# Patient Record
Sex: Male | Born: 1970
Health system: Southern US, Community
[De-identification: ages and names within clinical notes are randomized; demographics above are authoritative.]

## PROBLEM LIST (undated history)

## (undated) DIAGNOSIS — I10 Essential (primary) hypertension: Secondary | ICD-10-CM

---

## 2019-07-26 ENCOUNTER — Inpatient Hospital Stay (HOSPITAL_COMMUNITY)
Admission: EM | Admit: 2019-07-26 | Discharge: 2019-08-05 | DRG: 372 | Disposition: A | Discharge status: Home / Self Care | Payer: No Typology Code available for payment source | Attending: General Surgery | Admitting: General Surgery

## 2019-07-26 ENCOUNTER — Emergency Department (HOSPITAL_COMMUNITY): Payer: No Typology Code available for payment source

## 2019-07-26 DIAGNOSIS — Z20822 Contact with and (suspected) exposure to covid-19: Secondary | ICD-10-CM | POA: Diagnosis present

## 2019-07-26 DIAGNOSIS — B954 Other streptococcus as the cause of diseases classified elsewhere: Secondary | ICD-10-CM | POA: Diagnosis present

## 2019-07-26 DIAGNOSIS — Z1611 Resistance to penicillins: Secondary | ICD-10-CM | POA: Diagnosis present

## 2019-07-26 DIAGNOSIS — K3533 Acute appendicitis with perforation and localized peritonitis, with abscess: Principal | ICD-10-CM | POA: Diagnosis present

## 2019-07-26 DIAGNOSIS — B961 Klebsiella pneumoniae [K. pneumoniae] as the cause of diseases classified elsewhere: Secondary | ICD-10-CM | POA: Diagnosis present

## 2019-07-26 DIAGNOSIS — K3532 Acute appendicitis with perforation and localized peritonitis, without abscess: Secondary | ICD-10-CM | POA: Diagnosis present

## 2019-07-26 DIAGNOSIS — K651 Peritoneal abscess: Secondary | ICD-10-CM

## 2019-07-26 DIAGNOSIS — Z8249 Family history of ischemic heart disease and other diseases of the circulatory system: Secondary | ICD-10-CM

## 2019-07-26 DIAGNOSIS — K9189 Other postprocedural complications and disorders of digestive system: Secondary | ICD-10-CM | POA: Diagnosis not present

## 2019-07-26 DIAGNOSIS — Z79899 Other long term (current) drug therapy: Secondary | ICD-10-CM

## 2019-07-26 DIAGNOSIS — N289 Disorder of kidney and ureter, unspecified: Secondary | ICD-10-CM

## 2019-07-26 DIAGNOSIS — K567 Ileus, unspecified: Secondary | ICD-10-CM

## 2019-07-26 DIAGNOSIS — N179 Acute kidney failure, unspecified: Secondary | ICD-10-CM | POA: Diagnosis present

## 2019-07-26 DIAGNOSIS — I10 Essential (primary) hypertension: Secondary | ICD-10-CM | POA: Diagnosis not present

## 2019-07-26 DIAGNOSIS — Z0189 Encounter for other specified special examinations: Secondary | ICD-10-CM

## 2019-07-26 HISTORY — DX: Essential (primary) hypertension: I10

## 2019-07-26 LAB — CBC
HCT: 45.5 % (ref 39.0–52.0)
Hemoglobin: 15 g/dL (ref 13.0–17.0)
MCH: 31.3 pg (ref 26.0–34.0)
MCHC: 33 g/dL (ref 30.0–36.0)
MCV: 95 fL (ref 80.0–100.0)
Platelets: 352 10*3/uL (ref 150–400)
RBC: 4.79 MIL/uL (ref 4.22–5.81)
RDW: 12.8 % (ref 11.5–15.5)
WBC: 25.8 10*3/uL — ABNORMAL HIGH (ref 4.0–10.5)
nRBC: 0 % (ref 0.0–0.2)

## 2019-07-26 LAB — COMPREHENSIVE METABOLIC PANEL
ALT: 9 U/L (ref 0–44)
AST: 14 U/L — ABNORMAL LOW (ref 15–41)
Albumin: 3.5 g/dL (ref 3.5–5.0)
Alkaline Phosphatase: 75 U/L (ref 38–126)
Anion gap: 17 — ABNORMAL HIGH (ref 5–15)
BUN: 49 mg/dL — ABNORMAL HIGH (ref 6–20)
CO2: 24 mmol/L (ref 22–32)
Calcium: 9.5 mg/dL (ref 8.9–10.3)
Chloride: 99 mmol/L (ref 98–111)
Creatinine, Ser: 2.91 mg/dL — ABNORMAL HIGH (ref 0.61–1.24)
GFR calc Af Amer: 28 mL/min — ABNORMAL LOW (ref >60)
GFR calc non Af Amer: 24 mL/min — ABNORMAL LOW (ref >60)
Glucose, Bld: 158 mg/dL — ABNORMAL HIGH (ref 70–99)
Potassium: 4.9 mmol/L (ref 3.5–5.1)
Sodium: 140 mmol/L (ref 135–145)
Total Bilirubin: 0.8 mg/dL (ref 0.3–1.2)
Total Protein: 8.3 g/dL — ABNORMAL HIGH (ref 6.5–8.1)

## 2019-07-26 LAB — LIPASE, BLOOD: Lipase: 21 U/L (ref 11–51)

## 2019-07-26 MED ORDER — ONDANSETRON HCL 4 MG/2ML IJ SOLN
4.0000 mg | Freq: Once | INTRAMUSCULAR | Status: AC
  Administered 2019-07-26: 4 mg via INTRAVENOUS
  Filled 2019-07-26: qty 2

## 2019-07-26 MED ORDER — SODIUM CHLORIDE 0.9 % IV BOLUS
1000.0000 mL | Freq: Once | INTRAVENOUS | Status: AC
  Administered 2019-07-26: 1000 mL via INTRAVENOUS

## 2019-07-26 MED ORDER — MORPHINE SULFATE (PF) 4 MG/ML IV SOLN
4.0000 mg | Freq: Once | INTRAVENOUS | Status: AC
  Administered 2019-07-26: 4 mg via INTRAVENOUS
  Filled 2019-07-26: qty 1

## 2019-07-26 MED ORDER — IOHEXOL 9 MG/ML PO SOLN
ORAL | Status: AC
  Filled 2019-07-26: qty 1000

## 2019-07-26 MED ORDER — SODIUM CHLORIDE 0.9% FLUSH
3.0000 mL | Freq: Once | INTRAVENOUS | Status: AC
  Administered 2019-07-26: 3 mL via INTRAVENOUS

## 2019-07-26 NOTE — ED Triage Notes (Signed)
Pt here from home with c/o sob and abd pain  Started sat night along with some n/v . No etoh  And pt doesn't smoke

## 2019-07-26 NOTE — ED Provider Notes (Signed)
[facility] EMERGENCY DEPARTMENT Provider Note   CSN: [id number] Arrival date & time: 07/26/19  1701   History Chief complaint: Abdominal pain  Russell Wells is a 49 y.o. male.  The history is provided by the patient.  He has no significant past history and comes in because of abdominal pain for the last 3 days.  Pain started in the right midabdomen and is moved to the right lower abdomen.  It will occasionally go into his chest.  Pain waxes and wanes and is 10/10 at its worst.  Nothing makes it better, nothing makes it worse.  He has taken acetaminophen and ibuprofen without relief.  His sister gave him a dose of an antibiotic, but vomited after taking it.  He is not sure which antibiotic was.  There is associated nausea and vomiting.  He has not had fever or chills but has had sweats.  He denies constipation or diarrhea.  Urine output has been markedly decreased.  Appetite has been decreased as well.  He has never had similar pain before.  No past medical history on file.  There are no problems to display for this patient.   ** The histories are not reviewed yet. Please review them in the "History" navigator section and refresh this SmartLink.     No family history on file.  Social History   Tobacco Use  . Smoking status: Not on file  Substance Use Topics  . Alcohol use: Not on file  . Drug use: Not on file    Home Medications Prior to Admission medications   Not on File    Allergies    Patient has no known allergies.  Review of Systems   Review of Systems  All other systems reviewed and are negative.   Physical Exam Updated Vital Signs BP (!) 125/99   Pulse 72   Temp 97.9 F (36.6 C)   Resp 17   Ht 6' 1.5" (1.867 m)   Wt 68.9 kg   SpO2 97%   BMI 19.78 kg/m   Physical Exam Vitals and nursing note reviewed.   49 year old male, resting comfortably and in no acute distress. Vital signs are significant for elevated diastolic blood  pressure. Oxygen saturation is 97%, which is normal. Head is normocephalic and atraumatic. PERRLA, EOMI. Oropharynx is clear. Neck is nontender and supple without adenopathy or JVD. Back is nontender and there is no CVA tenderness. Lungs are clear without rales, wheezes, or rhonchi. Chest is nontender. Heart has regular rate and rhythm without murmur. Abdomen is soft, flat, with moderate to severe right lower quadrant tenderness.  There is no guarding or rebound tenderness, but there is tenderness to percussion.  There is mild tenderness to palpation in the left upper and left lower quadrants.  There are no masses or hepatosplenomegaly and peristalsis is hypoactive. Extremities have no cyanosis or edema, full range of motion is present. Skin is warm and dry without rash. Neurologic: Mental status is normal, cranial nerves are intact, there are no motor or sensory deficits.  ED Results / Procedures / Treatments   Labs (all labs ordered are listed, but only abnormal results are displayed) Labs Reviewed  COMPREHENSIVE METABOLIC PANEL - Abnormal; Notable for the following components:      Result Value   Glucose, Bld 158 (*)    BUN 49 (*)    Creatinine, Ser 2.91 (*)    Total Protein 8.3 (*)    AST 14 (*)  GFR calc non Af Amer 24 (*)    GFR calc Af Amer 28 (*)    Anion gap 17 (*)    All other components within normal limits  CBC - Abnormal; Notable for the following components:   WBC 25.8 (*)    All other components within normal limits  URINALYSIS, ROUTINE W REFLEX MICROSCOPIC - Abnormal; Notable for the following components:   Color, Urine AMBER (*)    APPearance CLOUDY (*)    Glucose, UA 50 (*)    Hgb urine dipstick SMALL (*)    Protein, ur 100 (*)    Non Squamous Epithelial 0-5 (*)    All other components within normal limits  RESPIRATORY PANEL BY RT PCR (FLU A&B, COVID)  LIPASE, BLOOD    EKG EKG Interpretation  Date/Time:  Monday July 26 2019 17:09:50 EDT Ventricular  Rate:  129 PR Interval:  120 QRS Duration: 74 QT Interval:  282 QTC Calculation: 413 R Axis:   51 Text Interpretation: Sinus tachycardia Biatrial enlargement Left ventricular hypertrophy ( Sokolow-Lyon , Romhilt-Estes ) Abnormal ECG No previous ECGs available Confirmed by Glynn Octave [phone]) on 07/26/2019 10:59:31 PM   Radiology CT ABDOMEN PELVIS WO CONTRAST  Result Date: 07/27/2019 CLINICAL DATA:  Right lower quadrant abdominal pain EXAM: CT ABDOMEN AND PELVIS WITHOUT CONTRAST TECHNIQUE: Multidetector CT imaging of the abdomen and pelvis was performed following the standard protocol without IV contrast. COMPARISON:  None. FINDINGS: Lower chest: The visualized heart size within normal limits. No pericardial fluid/thickening. No hiatal hernia. Minimal bibasilar dependent atelectasis. Hepatobiliary: The liver is normal in density without focal abnormality.The main portal vein is patent. No evidence of calcified gallstones, gallbladder wall thickening or biliary dilatation. Pancreas: Unremarkable. No pancreatic ductal dilatation or surrounding inflammatory changes. Spleen: Normal in size without focal abnormality. Adrenals/Urinary Tract: Both adrenal glands appear normal. The kidneys and collecting system appear normal without evidence of urinary tract calculus or hydronephrosis. Bladder is unremarkable. Stomach/Bowel: The stomach is normal in appearance. There is mildly dilated air and fluid-filled loops of small bowel measuring up to 3 cm without a focal area of narrowing. The distal ileal loops are decompressed. Within the right lower quadrant there is a dilated appendix measuring up to 9 mm in transverse dimension with surrounding mesenteric fat stranding changes. Adjacent to or within the appendix there is a small calcification and small foci of air. Extending from this region there is a loculated fluid and air collection measuring 5.9 x 2.2 cm, series 3, image 58. Surrounding fat stranding changes  are seen within the right lower quadrant. Vascular/Lymphatic: There are no enlarged mesenteric, retroperitoneal, or pelvic lymph nodes. Scattered aortic atherosclerotic calcifications are seen without aneurysmal dilatation. Reproductive: The prostate is unremarkable. Other: No evidence of abdominal wall mass or hernia. Musculoskeletal: No acute or significant osseous findings. IMPRESSION: 1. Findings consistent with acute appendicitis with a probable adjacent small foci of air and periappendiceal abscess measuring 5.9 x 2.2 cm. 2. Mildly dilated proximal small bowel loops, likely due to ileus. 3.  Aortic Atherosclerosis (ICD10-I70.0). Electronically Signed   By: Jonna Clark M.D.   On: 07/27/2019 02:41   DG Chest 2 View  Result Date: 07/26/2019 CLINICAL DATA:  Short of breath, abdominal pain for 2 days, nausea and vomiting EXAM: CHEST - 2 VIEW COMPARISON:  None. FINDINGS: Frontal and lateral views of the chest demonstrate an unremarkable cardiac silhouette. There is increased density in the left paratracheal region with deviation of the airway to the right, likely  due to an enlarged thyroid. Please correlate with physical exam findings. No airspace disease, effusion, or pneumothorax. No acute bony abnormalities. IMPRESSION: 1. No acute intrathoracic process. 2. Likely enlarged thyroid as above. Electronically Signed   By: Randa Ngo M.D.   On: 07/26/2019 17:55    Procedures Procedures (including critical care time)  Medications Ordered in ED Medications  sodium chloride flush (NS) 0.9 % injection 3 mL (has no administration in time range)  morphine 4 MG/ML injection 4 mg (has no administration in time range)  ondansetron (ZOFRAN) injection 4 mg (has no administration in time range)  sodium chloride 0.9 % bolus 1,000 mL (has no administration in time range)    ED Course  I have reviewed the triage vital signs and the nursing notes.  Pertinent labs & imaging results that were available during  my care of the patient were reviewed by me and considered in my medical decision making (see chart for details).  MDM Rules/Calculators/A&P Right lower quadrant pain concerning for appendicitis.  Consider diverticulitis, pancreatitis, urinary tract infection, urolithiasis.  Labs obtained prior to my seeing the patient show markedly elevated WBC of 25.8, mildly elevated glucose of 158, evidence of renal insufficiency with creatinine 2.91 and BUN 49.  No prior labs are available for comparison, and patient states that he does not have a primary care provider.  He moved from [place] but did not have any feelings with the medical system there.  I suspect elevated creatinine is mainly function of dehydration.  He will be sent for CT of abdomen and pelvis.  Because of elevated creatinine, scan will be done without IV contrast.  CT scan shows acute appendicitis with abscess and possible contained perforation.  He is started on Zosyn.  Case is discussed with Dr. Grandville Silos, on-call for general surgery, who agrees to admit the patient.  Final Clinical Impression(s) / ED Diagnoses Final diagnoses:  Acute appendicitis with appendiceal abscess  Renal insufficiency    Rx / DC Orders ED Discharge Orders    None       Delora Fuel, MD 20/25/42 [phone]

## 2019-07-27 ENCOUNTER — Inpatient Hospital Stay (HOSPITAL_COMMUNITY): Payer: No Typology Code available for payment source

## 2019-07-27 ENCOUNTER — Other Ambulatory Visit: Payer: Self-pay

## 2019-07-27 ENCOUNTER — Emergency Department (HOSPITAL_COMMUNITY): Payer: No Typology Code available for payment source

## 2019-07-27 ENCOUNTER — Encounter (HOSPITAL_COMMUNITY): Payer: Self-pay | Admitting: Radiology

## 2019-07-27 DIAGNOSIS — K3532 Acute appendicitis with perforation and localized peritonitis, without abscess: Secondary | ICD-10-CM | POA: Diagnosis not present

## 2019-07-27 DIAGNOSIS — K3533 Acute appendicitis with perforation and localized peritonitis, with abscess: Secondary | ICD-10-CM | POA: Diagnosis present

## 2019-07-27 DIAGNOSIS — Z8249 Family history of ischemic heart disease and other diseases of the circulatory system: Secondary | ICD-10-CM | POA: Diagnosis not present

## 2019-07-27 DIAGNOSIS — K9189 Other postprocedural complications and disorders of digestive system: Secondary | ICD-10-CM | POA: Diagnosis not present

## 2019-07-27 DIAGNOSIS — B954 Other streptococcus as the cause of diseases classified elsewhere: Secondary | ICD-10-CM | POA: Diagnosis present

## 2019-07-27 DIAGNOSIS — K567 Ileus, unspecified: Secondary | ICD-10-CM | POA: Diagnosis not present

## 2019-07-27 DIAGNOSIS — Z1611 Resistance to penicillins: Secondary | ICD-10-CM | POA: Diagnosis present

## 2019-07-27 DIAGNOSIS — N179 Acute kidney failure, unspecified: Secondary | ICD-10-CM | POA: Diagnosis present

## 2019-07-27 DIAGNOSIS — B961 Klebsiella pneumoniae [K. pneumoniae] as the cause of diseases classified elsewhere: Secondary | ICD-10-CM | POA: Diagnosis present

## 2019-07-27 DIAGNOSIS — Z20822 Contact with and (suspected) exposure to covid-19: Secondary | ICD-10-CM | POA: Diagnosis present

## 2019-07-27 DIAGNOSIS — Z79899 Other long term (current) drug therapy: Secondary | ICD-10-CM | POA: Diagnosis not present

## 2019-07-27 DIAGNOSIS — I10 Essential (primary) hypertension: Secondary | ICD-10-CM | POA: Diagnosis present

## 2019-07-27 LAB — BASIC METABOLIC PANEL
Anion gap: 14 (ref 5–15)
BUN: 56 mg/dL — ABNORMAL HIGH (ref 6–20)
CO2: 23 mmol/L (ref 22–32)
Calcium: 8.6 mg/dL — ABNORMAL LOW (ref 8.9–10.3)
Chloride: 104 mmol/L (ref 98–111)
Creatinine, Ser: 2.49 mg/dL — ABNORMAL HIGH (ref 0.61–1.24)
GFR calc Af Amer: 34 mL/min — ABNORMAL LOW (ref >60)
GFR calc non Af Amer: 29 mL/min — ABNORMAL LOW (ref >60)
Glucose, Bld: 90 mg/dL (ref 70–99)
Potassium: 4.9 mmol/L (ref 3.5–5.1)
Sodium: 141 mmol/L (ref 135–145)

## 2019-07-27 LAB — URINALYSIS, ROUTINE W REFLEX MICROSCOPIC
Bacteria, UA: NONE SEEN
Bilirubin Urine: NEGATIVE
Glucose, UA: 50 mg/dL — AB
Ketones, ur: NEGATIVE mg/dL
Leukocytes,Ua: NEGATIVE
Nitrite: NEGATIVE
Protein, ur: 100 mg/dL — AB
Specific Gravity, Urine: 1.02 (ref 1.005–1.030)
pH: 5 (ref 5.0–8.0)

## 2019-07-27 LAB — PROTIME-INR
INR: 1.3 — ABNORMAL HIGH (ref 0.8–1.2)
Prothrombin Time: 15.6 seconds — ABNORMAL HIGH (ref 11.4–15.2)

## 2019-07-27 LAB — RESPIRATORY PANEL BY RT PCR (FLU A&B, COVID)
Influenza A by PCR: NEGATIVE
Influenza B by PCR: NEGATIVE
SARS Coronavirus 2 by RT PCR: NEGATIVE

## 2019-07-27 LAB — HIV ANTIBODY (ROUTINE TESTING W REFLEX): HIV Screen 4th Generation wRfx: NONREACTIVE

## 2019-07-27 MED ORDER — MIDAZOLAM HCL 2 MG/2ML IJ SOLN
INTRAMUSCULAR | Status: AC | PRN
  Administered 2019-07-27 (×3): 1 mg via INTRAVENOUS

## 2019-07-27 MED ORDER — FLUMAZENIL 0.5 MG/5ML IV SOLN
INTRAVENOUS | Status: AC
  Filled 2019-07-27: qty 5

## 2019-07-27 MED ORDER — ACETAMINOPHEN 325 MG PO TABS: 650.0000 mg | ORAL_TABLET | Freq: Four times a day (QID) | ORAL | Status: DC | PRN

## 2019-07-27 MED ORDER — FENTANYL CITRATE (PF) 100 MCG/2ML IJ SOLN
INTRAMUSCULAR | Status: AC | PRN
  Administered 2019-07-27 (×3): 50 ug via INTRAVENOUS

## 2019-07-27 MED ORDER — SODIUM CHLORIDE 0.9 % IV SOLN: INTRAVENOUS | Status: DC

## 2019-07-27 MED ORDER — MIDAZOLAM HCL 2 MG/2ML IJ SOLN
INTRAMUSCULAR | Status: AC
  Filled 2019-07-27: qty 6

## 2019-07-27 MED ORDER — FENTANYL CITRATE (PF) 100 MCG/2ML IJ SOLN
INTRAMUSCULAR | Status: AC
  Filled 2019-07-27: qty 6

## 2019-07-27 MED ORDER — HYDROMORPHONE HCL 1 MG/ML IJ SOLN
1.0000 mg | INTRAMUSCULAR | Status: DC | PRN
  Administered 2019-07-27 – 2019-07-30 (×26): 1 mg via INTRAVENOUS
  Filled 2019-07-27 (×25): qty 1

## 2019-07-27 MED ORDER — PIPERACILLIN-TAZOBACTAM 3.375 G IVPB 30 MIN
3.3750 g | Freq: Once | INTRAVENOUS | Status: AC
  Administered 2019-07-27: 3.375 g via INTRAVENOUS
  Filled 2019-07-27: qty 50

## 2019-07-27 MED ORDER — ENOXAPARIN SODIUM 30 MG/0.3ML ~~LOC~~ SOLN
30.0000 mg | Freq: Every day | SUBCUTANEOUS | Status: DC
  Administered 2019-07-27 – 2019-07-28 (×2): 30 mg via SUBCUTANEOUS
  Filled 2019-07-27 (×2): qty 0.3

## 2019-07-27 MED ORDER — NALOXONE HCL 0.4 MG/ML IJ SOLN
INTRAMUSCULAR | Status: AC
  Filled 2019-07-27: qty 1

## 2019-07-27 MED ORDER — MORPHINE SULFATE (PF) 4 MG/ML IV SOLN
4.0000 mg | Freq: Once | INTRAVENOUS | Status: AC
  Administered 2019-07-27: 04:00:00 4 mg via INTRAVENOUS
  Filled 2019-07-27: qty 1

## 2019-07-27 MED ORDER — ONDANSETRON HCL 4 MG/2ML IJ SOLN
4.0000 mg | Freq: Four times a day (QID) | INTRAMUSCULAR | Status: DC | PRN
  Administered 2019-07-28 – 2019-08-02 (×3): 4 mg via INTRAVENOUS
  Filled 2019-07-27 (×4): qty 2

## 2019-07-27 MED ORDER — DIPHENHYDRAMINE HCL 25 MG PO CAPS
25.0000 mg | ORAL_CAPSULE | Freq: Four times a day (QID) | ORAL | Status: DC | PRN
  Administered 2019-07-28 – 2019-07-29 (×2): 25 mg via ORAL
  Filled 2019-07-27: qty 1

## 2019-07-27 MED ORDER — ONDANSETRON 4 MG PO TBDP: 4.0000 mg | ORAL_TABLET | Freq: Four times a day (QID) | ORAL | Status: DC | PRN

## 2019-07-27 MED ORDER — LIDOCAINE HCL 1 % IJ SOLN
INTRAMUSCULAR | Status: AC
  Filled 2019-07-27: qty 20

## 2019-07-27 MED ORDER — DIPHENHYDRAMINE HCL 50 MG/ML IJ SOLN: 25.0000 mg | Freq: Four times a day (QID) | INTRAMUSCULAR | Status: DC | PRN

## 2019-07-27 MED ORDER — PIPERACILLIN-TAZOBACTAM 3.375 G IVPB
3.3750 g | Freq: Three times a day (TID) | INTRAVENOUS | Status: DC
  Administered 2019-07-27 – 2019-08-05 (×27): 3.375 g via INTRAVENOUS
  Filled 2019-07-27 (×27): qty 50

## 2019-07-27 MED ORDER — ACETAMINOPHEN 650 MG RE SUPP: 650.0000 mg | Freq: Four times a day (QID) | RECTAL | Status: DC | PRN

## 2019-07-27 NOTE — Procedures (Signed)
  Procedure: CT RLQ abscess drain catheter placement 70f;  34ml purulent for GS, C&S EBL:   minimal Complications:  none immediate  See full dictation in [employer].  Thora Lance MD Main # [phone] Pager  [phone]

## 2019-07-27 NOTE — Plan of Care (Signed)
  Problem: Clinical Measurements: Goal: Respiratory complications will improve Outcome: Progressing   Problem: Clinical Measurements: Goal: Ability to maintain clinical measurements within normal limits will improve Outcome: Progressing   Problem: Clinical Measurements: Goal: Respiratory complications will improve Outcome: Progressing   Problem: Nutrition: Goal: Adequate nutrition will be maintained Outcome: Not Progressing

## 2019-07-27 NOTE — H&P (Signed)
Russell Wells is an 49 y.o. male.   Chief Complaint: RLQ abdominal pain HPI: 49 year old male developed right lower quadrant abdominal pain early Saturday morning.  The pain initially got a little bit better but then worsened again and has been quite severe.  He has associated nausea and did vomit one time.  He has not had any appetite.  He came to the emergency department for further evaluation and CT scan of the abdomen pelvis demonstrates perforated appendicitis with abscess.  I was asked to see him for admission.  He reports that he is otherwise healthy.  He recently moved here from [place].  History reviewed. No pertinent past medical history.   No family history on file. Social History:  has no history on file for tobacco, alcohol, and drug.  Allergies: No Known Allergies  (Not in a hospital admission)   Results for orders placed or performed during the hospital encounter of 07/26/19 (from the past 48 hour(s))  Urinalysis, Routine w reflex microscopic     Status: Abnormal   Collection Time: 07/26/19 12:40 AM  Result Value Ref Range   Color, Urine AMBER (A) YELLOW    Comment: BIOCHEMICALS MAY BE AFFECTED BY COLOR   APPearance CLOUDY (A) CLEAR   Specific Gravity, Urine 1.020 1.005 - 1.030   pH 5.0 5.0 - 8.0   Glucose, UA 50 (A) NEGATIVE mg/dL   Hgb urine dipstick SMALL (A) NEGATIVE   Bilirubin Urine NEGATIVE NEGATIVE   Ketones, ur NEGATIVE NEGATIVE mg/dL   Protein, ur 253 (A) NEGATIVE mg/dL   Nitrite NEGATIVE NEGATIVE   Leukocytes,Ua NEGATIVE NEGATIVE   RBC / HPF 0-5 0 - 5 RBC/hpf   WBC, UA 11-20 0 - 5 WBC/hpf   Bacteria, UA NONE SEEN NONE SEEN   Squamous Epithelial / LPF 0-5 0 - 5   Mucus PRESENT    Hyaline Casts, UA PRESENT    Amorphous Crystal PRESENT    Non Squamous Epithelial 0-5 (A) NONE SEEN    Comment: Performed at [facility], [address] [place], [place] [address]  Lipase, blood     Status: None   Collection Time: 07/26/19  5:20 PM  Result Value  Ref Range   Lipase 21 11 - 51 U/L    Comment: Performed at [facility], [address], [place], [place] [address]  Comprehensive metabolic panel     Status: Abnormal   Collection Time: 07/26/19  5:20 PM  Result Value Ref Range   Sodium 140 135 - 145 mmol/L   Potassium 4.9 3.5 - 5.1 mmol/L   Chloride 99 98 - 111 mmol/L   CO2 24 22 - 32 mmol/L   Glucose, Bld 158 (H) 70 - 99 mg/dL    Comment: Glucose reference range applies only to samples taken after fasting for at least 8 hours.   BUN 49 (H) 6 - 20 mg/dL   Creatinine, Ser 5.95 (H) 0.61 - 1.24 mg/dL   Calcium 9.5 8.9 - 63.8 mg/dL   Total Protein 8.3 (H) 6.5 - 8.1 g/dL   Albumin 3.5 3.5 - 5.0 g/dL   AST 14 (L) 15 - 41 U/L   ALT 9 0 - 44 U/L   Alkaline Phosphatase 75 38 - 126 U/L   Total Bilirubin 0.8 0.3 - 1.2 mg/dL   GFR calc non Af Amer 24 (L) >60 mL/min   GFR calc Af Amer 28 (L) >60 mL/min   Anion gap 17 (H) 5 - 15  Comment: Performed at [facility], [address], [place], [place] [address]  CBC     Status: Abnormal   Collection Time: 07/26/19  5:20 PM  Result Value Ref Range   WBC 25.8 (H) 4.0 - 10.5 K/uL   RBC 4.79 4.22 - 5.81 MIL/uL   Hemoglobin 15.0 13.0 - 17.0 g/dL   HCT 93.2 67.1 - 24.5 %   MCV 95.0 80.0 - 100.0 fL   MCH 31.3 26.0 - 34.0 pg   MCHC 33.0 30.0 - 36.0 g/dL   RDW 80.9 98.3 - 38.2 %   Platelets 352 150 - 400 K/uL   nRBC 0.0 0.0 - 0.2 %    Comment: Performed at [facility], [address], [place], [place] [address]   CT ABDOMEN PELVIS WO CONTRAST  Result Date: 07/27/2019 CLINICAL DATA:  Right lower quadrant abdominal pain EXAM: CT ABDOMEN AND PELVIS WITHOUT CONTRAST TECHNIQUE: Multidetector CT imaging of the abdomen and pelvis was performed following the standard protocol without IV contrast. COMPARISON:  None. FINDINGS: Lower chest: The visualized heart size within normal limits. No pericardial fluid/thickening. No hiatal hernia. Minimal bibasilar dependent atelectasis.  Hepatobiliary: The liver is normal in density without focal abnormality.The main portal vein is patent. No evidence of calcified gallstones, gallbladder wall thickening or biliary dilatation. Pancreas: Unremarkable. No pancreatic ductal dilatation or surrounding inflammatory changes. Spleen: Normal in size without focal abnormality. Adrenals/Urinary Tract: Both adrenal glands appear normal. The kidneys and collecting system appear normal without evidence of urinary tract calculus or hydronephrosis. Bladder is unremarkable. Stomach/Bowel: The stomach is normal in appearance. There is mildly dilated air and fluid-filled loops of small bowel measuring up to 3 cm without a focal area of narrowing. The distal ileal loops are decompressed. Within the right lower quadrant there is a dilated appendix measuring up to 9 mm in transverse dimension with surrounding mesenteric fat stranding changes. Adjacent to or within the appendix there is a small calcification and small foci of air. Extending from this region there is a loculated fluid and air collection measuring 5.9 x 2.2 cm, series 3, image 58. Surrounding fat stranding changes are seen within the right lower quadrant. Vascular/Lymphatic: There are no enlarged mesenteric, retroperitoneal, or pelvic lymph nodes. Scattered aortic atherosclerotic calcifications are seen without aneurysmal dilatation. Reproductive: The prostate is unremarkable. Other: No evidence of abdominal wall mass or hernia. Musculoskeletal: No acute or significant osseous findings. IMPRESSION: 1. Findings consistent with acute appendicitis with a probable adjacent small foci of air and periappendiceal abscess measuring 5.9 x 2.2 cm. 2. Mildly dilated proximal small bowel loops, likely due to ileus. 3.  Aortic Atherosclerosis (ICD10-I70.0). Electronically Signed   By: Jonna Clark M.D.   On: 07/27/2019 02:41   DG Chest 2 View  Result Date: 07/26/2019 CLINICAL DATA:  Short of breath, abdominal pain  for 2 days, nausea and vomiting EXAM: CHEST - 2 VIEW COMPARISON:  None. FINDINGS: Frontal and lateral views of the chest demonstrate an unremarkable cardiac silhouette. There is increased density in the left paratracheal region with deviation of the airway to the right, likely due to an enlarged thyroid. Please correlate with physical exam findings. No airspace disease, effusion, or pneumothorax. No acute bony abnormalities. IMPRESSION: 1. No acute intrathoracic process. 2. Likely enlarged thyroid as above. Electronically Signed   By: Sharlet Salina M.D.   On: 07/26/2019 17:55    Review of Systems  Constitutional: Positive for fatigue.  HENT: Negative.   Eyes: Negative.  Respiratory: Positive for shortness of breath.   Cardiovascular: Negative.   Gastrointestinal: Positive for abdominal pain, nausea and vomiting.  Endocrine: Negative.   Genitourinary: Negative.   Musculoskeletal: Negative.   Skin: Negative.   Allergic/Immunologic: Negative.   Neurological: Negative.   Hematological: Negative.   Psychiatric/Behavioral: Negative.     Blood pressure (!) 166/108, pulse 95, temperature 97.9 F (36.6 C), resp. rate (!) 22, height 6' 1.5" (1.867 m), weight 68.9 kg, SpO2 96 %. Physical Exam  Constitutional: He is oriented to person, place, and time. He appears well-developed and well-nourished. No distress.  HENT:  Head: Normocephalic.  Right Ear: External ear normal.  Left Ear: External ear normal.  Mouth/Throat: Oropharynx is clear and moist.  Eyes: Pupils are equal, round, and reactive to light. EOM are normal. Right eye exhibits no discharge. Left eye exhibits no discharge. No scleral icterus.  Neck: No thyromegaly present.  Cardiovascular: Normal rate, regular rhythm, normal heart sounds and intact distal pulses.  Respiratory: Effort normal and breath sounds normal. No respiratory distress. He has no wheezes. He has no rales.  GI: Soft. Bowel sounds are normal. He exhibits no  distension. There is abdominal tenderness. There is no rebound and no guarding.  Tender right lower quadrant, no hepatosplenomegaly  Musculoskeletal:        General: No deformity or edema. Normal range of motion.     Cervical back: Neck supple.  Lymphadenopathy:    He has no cervical adenopathy.  Neurological: He is alert and oriented to person, place, and time. He exhibits normal muscle tone.  Skin: Skin is warm and dry. No rash noted.  Psychiatric: He has a normal mood and affect.  Alert and oriented x3     Assessment/Plan Perforated appendicitis with abscess -Zosyn IV, NPO, IR consult for percutaneous drain.  Likely interval appendectomy in the future.  Acute kidney injury -IV fluids and follow-up labs  Admit to inpatient  Zenovia Jarred, MD 07/27/2019, 3:58 AM

## 2019-07-27 NOTE — Consult Note (Signed)
Chief Complaint: Intra abdominal pain - periappendiceal abscess  Referring Physician(s): Dr. Elwyn Lade  Supervising Physician: Richarda Overlie  Patient Status: [facility] - In-pt  History of Present Illness: Russell Wells is a 49 y.o. male Presented to the ED with RLQ persistent and worsening abdominal pain X 3 days found to have a periappendiceal abscess. Team is requesting abscess drain placement.   History reviewed. No pertinent past medical history.  History reviewed. No pertinent surgical history.  Allergies: Patient has no known allergies.  Medications: Prior to Admission medications   Not on File     History reviewed. No pertinent family history.  Social History   Socioeconomic History  . Marital status: Not on file    Spouse name: Not on file  . Number of children: Not on file  . Years of education: Not on file  . Highest education level: Not on file  Occupational History  . Not on file  Tobacco Use  . Smoking status: Never Smoker  . Smokeless tobacco: Never Used  Substance and Sexual Activity  . Alcohol use: Not on file  . Drug use: Not on file  . Sexual activity: Not on file  Other Topics Concern  . Not on file  Social History Narrative  . Not on file   Social Determinants of Health   Financial Resource Strain:   . Difficulty of Paying Living Expenses:   Food Insecurity:   . Worried About Programme researcher, broadcasting/film/video in the Last Year:   . Barista in the Last Year:   Transportation Needs:   . Freight forwarder (Medical):   Marland Kitchen Lack of Transportation (Non-Medical):   Physical Activity:   . Days of Exercise per Week:   . Minutes of Exercise per Session:   Stress:   . Feeling of Stress :   Social Connections:   . Frequency of Communication with Friends and Family:   . Frequency of Social Gatherings with Friends and Family:   . Attends Religious Services:   . Active Member of Clubs or Organizations:   . Attends Banker Meetings:    Marland Kitchen Marital Status:     Review of Systems: A 12 point ROS discussed and pertinent positives are indicated in the HPI above.  All other systems are negative.  Review of Systems  Constitutional: Negative for fever.  HENT: Negative for congestion.   Respiratory: Negative for cough and shortness of breath.   Cardiovascular: Negative for chest pain.  Gastrointestinal: Positive for abdominal pain.  Neurological: Negative for headaches.  Psychiatric/Behavioral: Negative for behavioral problems and confusion.    Vital Signs: BP (!) 148/105 (BP Location: Left Arm)   Pulse 96   Temp 97.7 F (36.5 C) (Oral)   Resp 18   Ht 6' 1.5" (1.867 m)   Wt 152 lb (68.9 kg)   SpO2 97%   BMI 19.78 kg/m   Physical Exam Vitals and nursing note reviewed.  Constitutional:      Appearance: He is well-developed.  HENT:     Head: Normocephalic.  Cardiovascular:     Rate and Rhythm: Normal rate and regular rhythm.     Heart sounds: Normal heart sounds.  Pulmonary:     Effort: Pulmonary effort is normal.     Breath sounds: Normal breath sounds.  Musculoskeletal:        General: Normal range of motion.     Cervical back: Normal range of motion.  Skin:    General: Skin  is dry.  Neurological:     Mental Status: He is alert and oriented to person, place, and time.     Imaging: CT ABDOMEN PELVIS WO CONTRAST  Result Date: 07/27/2019 CLINICAL DATA:  Right lower quadrant abdominal pain EXAM: CT ABDOMEN AND PELVIS WITHOUT CONTRAST TECHNIQUE: Multidetector CT imaging of the abdomen and pelvis was performed following the standard protocol without IV contrast. COMPARISON:  None. FINDINGS: Lower chest: The visualized heart size within normal limits. No pericardial fluid/thickening. No hiatal hernia. Minimal bibasilar dependent atelectasis. Hepatobiliary: The liver is normal in density without focal abnormality.The main portal vein is patent. No evidence of calcified gallstones, gallbladder wall thickening or  biliary dilatation. Pancreas: Unremarkable. No pancreatic ductal dilatation or surrounding inflammatory changes. Spleen: Normal in size without focal abnormality. Adrenals/Urinary Tract: Both adrenal glands appear normal. The kidneys and collecting system appear normal without evidence of urinary tract calculus or hydronephrosis. Bladder is unremarkable. Stomach/Bowel: The stomach is normal in appearance. There is mildly dilated air and fluid-filled loops of small bowel measuring up to 3 cm without a focal area of narrowing. The distal ileal loops are decompressed. Within the right lower quadrant there is a dilated appendix measuring up to 9 mm in transverse dimension with surrounding mesenteric fat stranding changes. Adjacent to or within the appendix there is a small calcification and small foci of air. Extending from this region there is a loculated fluid and air collection measuring 5.9 x 2.2 cm, series 3, image 58. Surrounding fat stranding changes are seen within the right lower quadrant. Vascular/Lymphatic: There are no enlarged mesenteric, retroperitoneal, or pelvic lymph nodes. Scattered aortic atherosclerotic calcifications are seen without aneurysmal dilatation. Reproductive: The prostate is unremarkable. Other: No evidence of abdominal wall mass or hernia. Musculoskeletal: No acute or significant osseous findings. IMPRESSION: 1. Findings consistent with acute appendicitis with a probable adjacent small foci of air and periappendiceal abscess measuring 5.9 x 2.2 cm. 2. Mildly dilated proximal small bowel loops, likely due to ileus. 3.  Aortic Atherosclerosis (ICD10-I70.0). Electronically Signed   By: Prudencio Pair M.D.   On: 07/27/2019 02:41   DG Chest 2 View  Result Date: 07/26/2019 CLINICAL DATA:  Short of breath, abdominal pain for 2 days, nausea and vomiting EXAM: CHEST - 2 VIEW COMPARISON:  None. FINDINGS: Frontal and lateral views of the chest demonstrate an unremarkable cardiac silhouette. There  is increased density in the left paratracheal region with deviation of the airway to the right, likely due to an enlarged thyroid. Please correlate with physical exam findings. No airspace disease, effusion, or pneumothorax. No acute bony abnormalities. IMPRESSION: 1. No acute intrathoracic process. 2. Likely enlarged thyroid as above. Electronically Signed   By: Randa Ngo M.D.   On: 07/26/2019 17:55    Labs:  CBC: Recent Labs    07/26/19 1720  WBC 25.8*  HGB 15.0  HCT 45.5  PLT 352    COAGS: Recent Labs    07/27/19 0506  INR 1.3*    BMP: Recent Labs    07/26/19 1720  NA 140  K 4.9  CL 99  CO2 24  GLUCOSE 158*  BUN 49*  CALCIUM 9.5  CREATININE 2.91*  GFRNONAA 24*  GFRAA 28*    LIVER FUNCTION TESTS: Recent Labs    07/26/19 1720  BILITOT 0.8  AST 14*  ALT 9  ALKPHOS 75  PROT 8.3*  ALBUMIN 3.5    TUMOR MARKERS: No results for input(s): AFPTM, CEA, CA199, CHROMGRNA in the last 8760 hours.  Assessment and Plan:  49 y.o, male inpatient. Presented to the ED with RLQ persistent and worsening abdominal pain X 3 days found to have a periappendiceal abscess. Team is requesting abscess drain placement.  Pertinent Imaging 3.23.21 - CT abd pelvis reads Findings consistent with acute appendicitis with a probable adjacent small foci of air and periappendiceal abscess measuring 5.9 x 2.2 cm.  Pertinent IR History none  Pertinent Allergies NKDA  WBC is 25.8, Cr 2.91 BUN 49, INR 1.3 All other labs and medications are within acceptable parameters.  Patient is afebrile.  IR consulted for possible intra abdominal periappendical abscess drain placement. Case has been reviewed and procedure approved by Dr. Deanne Coffer.  Patient tentatively scheduled for 3.23.21.  Team instructed to: Keep Patient to be NPO after midnight Hold prophylactic anticoagulation 24 hours prior to scheduled procedure.   IR will call patient when ready.  Risks and benefits of intra  abdominal abscess was discussed with the patient and/or patient's family including, but not limited to bleeding, infection, damage to adjacent structures or low yield requiring additional tests.  All of the questions were answered and there is agreement to proceed.  Consent signed and in chart.   Thank you for this interesting consult.  I greatly enjoyed meeting Welcome Fults and look forward to participating in their care.  A copy of this report was sent to the requesting provider on this date.  Electronically Signed: Marletta Lor, NP 07/27/2019, 9:31 AM   I spent a total of 40 Minutes    in face to face in clinical consultation, greater than 50% of which was counseling/coordinating care for intra abdominal abscess drain placement

## 2019-07-27 NOTE — Progress Notes (Signed)
Pharmacy Antibiotic Note  Russell Wells is a 49 y.o. male admitted on 07/26/2019 with perforated appendicitis with abscess.  Pharmacy has been consulted for zosyn  dosing.  Plan: Zosyn 3.375g IV q8h (4 hour infusion).  F/u renal function, cultures and clinical course  Height: 6' 1.5" (186.7 cm) Weight: 152 lb (68.9 kg) IBW/kg (Calculated) : 81.05  Temp (24hrs), Avg:98 F (36.7 C), Min:97.9 F (36.6 C), Max:98 F (36.7 C)  Recent Labs  Lab 07/26/19 1720  WBC 25.8*  CREATININE 2.91*    Estimated Creatinine Clearance: 29.9 mL/min (A) (by C-G formula based on SCr of 2.91 mg/dL (H)).    No Known Allergies   Thank you for allowing pharmacy to be a part of this patient's care.  Talbert Cage Poteet 07/27/2019 4:03 AM

## 2019-07-27 NOTE — Progress Notes (Signed)
Patient ID: Russell Wells, male   DOB: 20-Apr-1971, 49 y.o.   MRN: [id number] Ir drain in, bmet pending today, will let him have clears today, cont abx, start lovenox

## 2019-07-27 NOTE — ED Notes (Signed)
Pt transported to CT ?

## 2019-07-28 ENCOUNTER — Inpatient Hospital Stay (HOSPITAL_COMMUNITY): Payer: No Typology Code available for payment source

## 2019-07-28 LAB — CBC
HCT: 41.5 % (ref 39.0–52.0)
Hemoglobin: 13.3 g/dL (ref 13.0–17.0)
MCH: 30.5 pg (ref 26.0–34.0)
MCHC: 32 g/dL (ref 30.0–36.0)
MCV: 95.2 fL (ref 80.0–100.0)
Platelets: 400 10*3/uL (ref 150–400)
RBC: 4.36 MIL/uL (ref 4.22–5.81)
RDW: 13 % (ref 11.5–15.5)
WBC: 19.5 10*3/uL — ABNORMAL HIGH (ref 4.0–10.5)
nRBC: 0 % (ref 0.0–0.2)

## 2019-07-28 LAB — BASIC METABOLIC PANEL
Anion gap: 12 (ref 5–15)
BUN: 46 mg/dL — ABNORMAL HIGH (ref 6–20)
CO2: 23 mmol/L (ref 22–32)
Calcium: 8.7 mg/dL — ABNORMAL LOW (ref 8.9–10.3)
Chloride: 105 mmol/L (ref 98–111)
Creatinine, Ser: 1.77 mg/dL — ABNORMAL HIGH (ref 0.61–1.24)
GFR calc Af Amer: 51 mL/min — ABNORMAL LOW (ref >60)
GFR calc non Af Amer: 44 mL/min — ABNORMAL LOW (ref >60)
Glucose, Bld: 127 mg/dL — ABNORMAL HIGH (ref 70–99)
Potassium: 4.9 mmol/L (ref 3.5–5.1)
Sodium: 140 mmol/L (ref 135–145)

## 2019-07-28 MED ORDER — HYDROCODONE-ACETAMINOPHEN 5-325 MG PO TABS
1.0000 | ORAL_TABLET | ORAL | Status: DC | PRN
  Administered 2019-07-29 – 2019-07-30 (×2): 2 via ORAL
  Filled 2019-07-28 (×2): qty 2

## 2019-07-28 MED ORDER — PHENOL 1.4 % MT LIQD: 1.0000 | OROMUCOSAL | Status: DC | PRN

## 2019-07-28 MED ORDER — HYDRALAZINE HCL 20 MG/ML IJ SOLN
5.0000 mg | Freq: Four times a day (QID) | INTRAMUSCULAR | Status: DC | PRN
  Administered 2019-07-28 – 2019-07-30 (×4): 20 mg via INTRAVENOUS
  Administered 2019-07-30: 03:00:00 10 mg via INTRAVENOUS
  Administered 2019-07-31: 05:00:00 20 mg via INTRAVENOUS
  Filled 2019-07-28 (×6): qty 1

## 2019-07-28 MED ORDER — SODIUM CHLORIDE 0.9% FLUSH
5.0000 mL | Freq: Three times a day (TID) | INTRAVENOUS | Status: DC
  Administered 2019-07-28 – 2019-08-05 (×17): 5 mL

## 2019-07-28 MED ORDER — MENTHOL 3 MG MT LOZG
1.0000 | LOZENGE | OROMUCOSAL | Status: DC | PRN
  Administered 2019-07-28: 16:00:00 3 mg via ORAL
  Filled 2019-07-28: qty 9

## 2019-07-28 MED ORDER — METOPROLOL TARTRATE 5 MG/5ML IV SOLN
5.0000 mg | Freq: Four times a day (QID) | INTRAVENOUS | Status: DC | PRN
  Administered 2019-07-28: 23:00:00 10 mg via INTRAVENOUS
  Filled 2019-07-28: qty 10

## 2019-07-28 MED ORDER — ENOXAPARIN SODIUM 40 MG/0.4ML ~~LOC~~ SOLN
40.0000 mg | Freq: Every day | SUBCUTANEOUS | Status: DC
  Administered 2019-07-29 – 2019-08-02 (×5): 40 mg via SUBCUTANEOUS
  Filled 2019-07-28 (×5): qty 0.4

## 2019-07-28 NOTE — Progress Notes (Signed)
Subjective/Chief Complaint: Abdomen hurts much worse after drain, no bowel function   Objective: Vital signs in last 24 hours: Temp:  [97.6 F (36.4 C)-98.7 F (37.1 C)] 97.6 F (36.4 C) (03/24 0723) Pulse Rate:  [78-102] 98 (03/24 0723) Resp:  [15-23] 17 (03/24 0723) BP: (117-172)/(74-124) 172/124 (03/24 0723) SpO2:  [94 %-100 %] 98 % (03/24 0723)    Intake/Output from previous day: 03/23 0701 - 03/24 0700 In: 650 [P.O.:600] Out: 1950 [Urine:1950] Intake/Output this shift: No intake/output data recorded.  Resp: clear to auscultation bilaterally Cardio: rrr GI: tender rlq and suprapubic, nondistended  Lab Results:  Recent Labs    07/26/19 1720 07/28/19 0606  WBC 25.8* 19.5*  HGB 15.0 13.3  HCT 45.5 41.5  PLT 352 400   BMET Recent Labs    07/27/19 1144 07/28/19 0606  NA 141 140  K 4.9 4.9  CL 104 105  CO2 23 23  GLUCOSE 90 127*  BUN 56* 46*  CREATININE 2.49* 1.77*  CALCIUM 8.6* 8.7*   PT/INR Recent Labs    07/27/19 0506  LABPROT 15.6*  INR 1.3*   ABG No results for input(s): PHART, HCO3 in the last 72 hours.  Invalid input(s): PCO2, PO2  Studies/Results: CT ABDOMEN PELVIS WO CONTRAST  Result Date: 07/27/2019 CLINICAL DATA:  Right lower quadrant abdominal pain EXAM: CT ABDOMEN AND PELVIS WITHOUT CONTRAST TECHNIQUE: Multidetector CT imaging of the abdomen and pelvis was performed following the standard protocol without IV contrast. COMPARISON:  None. FINDINGS: Lower chest: The visualized heart size within normal limits. No pericardial fluid/thickening. No hiatal hernia. Minimal bibasilar dependent atelectasis. Hepatobiliary: The liver is normal in density without focal abnormality.The main portal vein is patent. No evidence of calcified gallstones, gallbladder wall thickening or biliary dilatation. Pancreas: Unremarkable. No pancreatic ductal dilatation or surrounding inflammatory changes. Spleen: Normal in size without focal abnormality.  Adrenals/Urinary Tract: Both adrenal glands appear normal. The kidneys and collecting system appear normal without evidence of urinary tract calculus or hydronephrosis. Bladder is unremarkable. Stomach/Bowel: The stomach is normal in appearance. There is mildly dilated air and fluid-filled loops of small bowel measuring up to 3 cm without a focal area of narrowing. The distal ileal loops are decompressed. Within the right lower quadrant there is a dilated appendix measuring up to 9 mm in transverse dimension with surrounding mesenteric fat stranding changes. Adjacent to or within the appendix there is a small calcification and small foci of air. Extending from this region there is a loculated fluid and air collection measuring 5.9 x 2.2 cm, series 3, image 58. Surrounding fat stranding changes are seen within the right lower quadrant. Vascular/Lymphatic: There are no enlarged mesenteric, retroperitoneal, or pelvic lymph nodes. Scattered aortic atherosclerotic calcifications are seen without aneurysmal dilatation. Reproductive: The prostate is unremarkable. Other: No evidence of abdominal wall mass or hernia. Musculoskeletal: No acute or significant osseous findings. IMPRESSION: 1. Findings consistent with acute appendicitis with a probable adjacent small foci of air and periappendiceal abscess measuring 5.9 x 2.2 cm. 2. Mildly dilated proximal small bowel loops, likely due to ileus. 3.  Aortic Atherosclerosis (ICD10-I70.0). Electronically Signed   By: Prudencio Pair M.D.   On: 07/27/2019 02:41   DG Chest 2 View  Result Date: 07/26/2019 CLINICAL DATA:  Short of breath, abdominal pain for 2 days, nausea and vomiting EXAM: CHEST - 2 VIEW COMPARISON:  None. FINDINGS: Frontal and lateral views of the chest demonstrate an unremarkable cardiac silhouette. There is increased density in the left paratracheal  region with deviation of the airway to the right, likely due to an enlarged thyroid. Please correlate with physical  exam findings. No airspace disease, effusion, or pneumothorax. No acute bony abnormalities. IMPRESSION: 1. No acute intrathoracic process. 2. Likely enlarged thyroid as above. Electronically Signed   By: Sharlet Salina M.D.   On: 07/26/2019 17:55   CT IMAGE GUIDED DRAINAGE BY PERCUTANEOUS CATHETER  Result Date: 07/27/2019 CLINICAL DATA:  Suspected periappendiceal abscess. Drainage requested EXAM: CT GUIDED DRAINAGE OF PERITONEAL ABSCESS ANESTHESIA/SEDATION: Intravenous Fentanyl and Versed 3mg  were administered as conscious sedation during continuous monitoring of the patient?s level of consciousness and physiological / cardiorespiratory status by the radiology RN, with a total moderate sedation time of 17 minutes. PROCEDURE: The procedure, risks, benefits, and alternatives were explained to the patient. Questions regarding the procedure were encouraged and answered. The patient understands and consents to the procedure. Select axial scans through the lower abdomen were obtained. The right lower quadrant gas and fluid collection was localized and an appropriate skin entry site was determined and marked. The operative field was prepped with chlorhexidinein a sterile fashion, and a sterile drape was applied covering the operative field. A sterile gown and sterile gloves were used for the procedure. Local anesthesia was provided with 1% Lidocaine. Under CT fluoroscopic guidance, 18 gauge trocar needle advanced into the collection. Purulent material returned through the hub. Amplatz wire advanced easily within the collection, position confirmed on CT. Tract dilated to facilitate placement of a 12 French pigtail drain catheter, formed within the central dependent aspect. 20 mL purulent material were aspirated, sent for Gram stain and culture. Catheter secured externally with 0 Prolene suture and StatLock and placed to gravity drain. The patient tolerated the procedure well. COMPLICATIONS: None immediate  FINDINGS: Right lower quadrant loculated gas/fluid collection localized. 12 French pigtail drain catheter placed as above. 20 mL purulent aspirate sent for Gram stain and culture. IMPRESSION: 1. Technically successful Right lower quadrant abscess drain catheter placement. Electronically Signed   By: M.D.   On: 07/27/2019 11:25    Anti-infectives: Anti-infectives (From admission, onward)   Start     Dose/Rate Route Frequency Ordered Stop   07/27/19 1200  piperacillin-tazobactam (ZOSYN) IVPB 3.375 g     3.375 g 12.5 mL/hr over 240 Minutes Intravenous Every 8 hours 07/27/19 0403     07/27/19 0315  piperacillin-tazobactam (ZOSYN) IVPB 3.375 g     3.375 g 100 mL/hr over 30 Minutes Intravenous  Once 07/27/19 0305 07/27/19 0401      Assessment/Plan: Perforated appendicitis with abscess -drain yesterday with worse pain concerning exam, will repeat ct this am asap -may need to go to or -npo, iv zosyn Acute kidney injury  -Cr improving -continue iv fluids Lovenox, scds   07/29/19 07/28/2019

## 2019-07-28 NOTE — Progress Notes (Signed)
Referring Physician(s): Violeta Gelinas  Supervising Physician: Oley Balm  Patient Status:  [facility] [place] - In-pt  Chief Complaint:  Abscess - F/U drain  Brief History:  Russell Wells is a 49 y.o. male who presented to the ED with RLQ abdominal pain X 3 days.  CT showed periappendiceal abscess.  He underwent drain placement yesterday by Dr. Deanne Coffer.  Subjective:  Complained of worsening abdominal pain since procedure yesterday. CT showed probable ileus. NGT now in place to suction.  Allergies: Patient has no known allergies.  Medications: Prior to Admission medications   Not on File     Vital Signs: BP (!) 172/124 (BP Location: Left Arm)   Pulse 98   Temp 97.6 F (36.4 C) (Oral)   Resp 17   Ht 6' 1.5" (1.867 m)   Wt 68.9 kg   SpO2 98%   BMI 19.78 kg/m   Physical Exam Constitutional:      Appearance: He is ill-appearing.  HENT:     Head: Normocephalic and atraumatic.     Nose:     Comments: NGT now in place to suction Cardiovascular:     Rate and Rhythm: Normal rate.  Pulmonary:     Effort: Pulmonary effort is normal. No respiratory distress.  Abdominal:     Comments: RLQ drain in place. Copious brown liquid in bag. ~ 100 mL in bag, but no output recorded in chart  Skin:    General: Skin is warm and dry.  Neurological:     General: No focal deficit present.     Mental Status: He is alert and oriented to person, place, and time.  Psychiatric:        Mood and Affect: Mood normal.        Behavior: Behavior normal.        Thought Content: Thought content normal.        Judgment: Judgment normal.     Imaging: CT ABDOMEN PELVIS WO CONTRAST  Result Date: 07/28/2019 CLINICAL DATA:  RIGHT lower quadrant abscess secondary to acute appendicitis, post percutaneous drainage yesterday, horrible abdominal pain since procedure from umbilicus to RIGHT lateral abdomen EXAM: CT ABDOMEN AND PELVIS WITHOUT CONTRAST TECHNIQUE: Multidetector CT imaging of the  abdomen and pelvis was performed following the standard protocol without IV contrast. Sagittal and coronal MPR images reconstructed from axial data set. Neither oral nor intravenous contrast administered. COMPARISON:  07/27/2019 FINDINGS: Lower chest: Mild bibasilar atelectasis and tiny pleural effusions greater on RIGHT Hepatobiliary: Gallbladder and liver normal appearance Pancreas: Normal appearance Spleen: Normal appearance Adrenals/Urinary Tract: Adrenal glands, kidneys, ureters, and bladder normal appearance Stomach/Bowel: Again seen enlarged appendix with thickened wall consistent with acute appendicitis. Near complete resolution of periappendiceal abscess collection seen on prior exam post percutaneous drainage, pigtail catheter noted. Gas and stool throughout colon, minimally prominent size. Multiple dilated small bowel loops containing fluid and retained contrast consistent with ileus. Distended stomach containing gas and fluid. Vascular/Lymphatic: Aorta contains multiple atherosclerotic calcifications, normal in caliber. No adenopathy. Reproductive: Unremarkable prostate gland Other: Small amount of free fluid in abdomen/pelvis and perihepatic. No free air. No hernia. Musculoskeletal: Osseous structures unremarkable. IMPRESSION: Acute appendicitis with significant decrease in size of periappendiceal abscess collection post percutaneous drainage. Small amount of free fluid in abdomen/pelvis. Dilated small bowel loops containing fluid and retained contrast consistent with ileus. Bibasilar atelectasis and tiny pleural effusions greater on RIGHT. No new intra-abdominal or intrapelvic abnormalities. Aortic Atherosclerosis (ICD10-I70.0). Electronically Signed   By: Angelyn Punt.D.  On: 07/28/2019 10:56   CT ABDOMEN PELVIS WO CONTRAST  Result Date: 07/27/2019 CLINICAL DATA:  Right lower quadrant abdominal pain EXAM: CT ABDOMEN AND PELVIS WITHOUT CONTRAST TECHNIQUE: Multidetector CT imaging of the abdomen  and pelvis was performed following the standard protocol without IV contrast. COMPARISON:  None. FINDINGS: Lower chest: The visualized heart size within normal limits. No pericardial fluid/thickening. No hiatal hernia. Minimal bibasilar dependent atelectasis. Hepatobiliary: The liver is normal in density without focal abnormality.The main portal vein is patent. No evidence of calcified gallstones, gallbladder wall thickening or biliary dilatation. Pancreas: Unremarkable. No pancreatic ductal dilatation or surrounding inflammatory changes. Spleen: Normal in size without focal abnormality. Adrenals/Urinary Tract: Both adrenal glands appear normal. The kidneys and collecting system appear normal without evidence of urinary tract calculus or hydronephrosis. Bladder is unremarkable. Stomach/Bowel: The stomach is normal in appearance. There is mildly dilated air and fluid-filled loops of small bowel measuring up to 3 cm without a focal area of narrowing. The distal ileal loops are decompressed. Within the right lower quadrant there is a dilated appendix measuring up to 9 mm in transverse dimension with surrounding mesenteric fat stranding changes. Adjacent to or within the appendix there is a small calcification and small foci of air. Extending from this region there is a loculated fluid and air collection measuring 5.9 x 2.2 cm, series 3, image 58. Surrounding fat stranding changes are seen within the right lower quadrant. Vascular/Lymphatic: There are no enlarged mesenteric, retroperitoneal, or pelvic lymph nodes. Scattered aortic atherosclerotic calcifications are seen without aneurysmal dilatation. Reproductive: The prostate is unremarkable. Other: No evidence of abdominal wall mass or hernia. Musculoskeletal: No acute or significant osseous findings. IMPRESSION: 1. Findings consistent with acute appendicitis with a probable adjacent small foci of air and periappendiceal abscess measuring 5.9 x 2.2 cm. 2. Mildly  dilated proximal small bowel loops, likely due to ileus. 3.  Aortic Atherosclerosis (ICD10-I70.0). Electronically Signed   By: Prudencio Pair M.D.   On: 07/27/2019 02:41   DG Chest 2 View  Result Date: 07/26/2019 CLINICAL DATA:  Short of breath, abdominal pain for 2 days, nausea and vomiting EXAM: CHEST - 2 VIEW COMPARISON:  None. FINDINGS: Frontal and lateral views of the chest demonstrate an unremarkable cardiac silhouette. There is increased density in the left paratracheal region with deviation of the airway to the right, likely due to an enlarged thyroid. Please correlate with physical exam findings. No airspace disease, effusion, or pneumothorax. No acute bony abnormalities. IMPRESSION: 1. No acute intrathoracic process. 2. Likely enlarged thyroid as above. Electronically Signed   By: Randa Ngo M.D.   On: 07/26/2019 17:55   DG Abd Portable 1V  Result Date: 07/28/2019 CLINICAL DATA:  Check gastric catheter placement EXAM: PORTABLE ABDOMEN - 1 VIEW COMPARISON:  None FINDINGS: Gastric catheter is noted with the tip in the stomach. Proximal side port is also within the stomach. Scattered small bowel dilatation is noted. No free air is seen. IMPRESSION: Gastric catheter within the stomach. Persistent changes of small-bowel obstruction. Electronically Signed   By: Inez Catalina M.D.   On: 07/28/2019 12:33   CT IMAGE GUIDED DRAINAGE BY PERCUTANEOUS CATHETER  Result Date: 07/27/2019 CLINICAL DATA:  Suspected periappendiceal abscess. Drainage requested EXAM: CT GUIDED DRAINAGE OF PERITONEAL ABSCESS ANESTHESIA/SEDATION: Intravenous Fentanyl 152mcg and Versed 3mg  were administered as conscious sedation during continuous monitoring of the patient?s level of consciousness and physiological / cardiorespiratory status by the radiology RN, with a total moderate sedation time of 17 minutes. PROCEDURE:  The procedure, risks, benefits, and alternatives were explained to the patient. Questions regarding the procedure  were encouraged and answered. The patient understands and consents to the procedure. Select axial scans through the lower abdomen were obtained. The right lower quadrant gas and fluid collection was localized and an appropriate skin entry site was determined and marked. The operative field was prepped with chlorhexidinein a sterile fashion, and a sterile drape was applied covering the operative field. A sterile gown and sterile gloves were used for the procedure. Local anesthesia was provided with 1% Lidocaine. Under CT fluoroscopic guidance, 18 gauge trocar needle advanced into the collection. Purulent material returned through the hub. Amplatz wire advanced easily within the collection, position confirmed on CT. Tract dilated to facilitate placement of a 12 French pigtail drain catheter, formed within the central dependent aspect. 20 mL purulent material were aspirated, sent for Gram stain and culture. Catheter secured externally with 0 Prolene suture and StatLock and placed to gravity drain. The patient tolerated the procedure well. COMPLICATIONS: None immediate FINDINGS: Right lower quadrant loculated gas/fluid collection localized. 12 French pigtail drain catheter placed as above. 20 mL purulent aspirate sent for Gram stain and culture. IMPRESSION: 1. Technically successful Right lower quadrant abscess drain catheter placement. Electronically Signed   By: Corlis Leak M.D.   On: 07/27/2019 11:25    Labs:  CBC: Recent Labs    07/26/19 1720 07/28/19 0606  WBC 25.8* 19.5*  HGB 15.0 13.3  HCT 45.5 41.5  PLT 352 400    COAGS: Recent Labs    07/27/19 0506  INR 1.3*    BMP: Recent Labs    07/26/19 1720 07/27/19 1144 07/28/19 0606  NA 140 141 140  K 4.9 4.9 4.9  CL 99 104 105  CO2 24 23 23   GLUCOSE 158* 90 127*  BUN 49* 56* 46*  CALCIUM 9.5 8.6* 8.7*  CREATININE 2.91* 2.49* 1.77*  GFRNONAA 24* 29* 44*  GFRAA 28* 34* 51*    LIVER FUNCTION TESTS: Recent Labs    07/26/19 1720    BILITOT 0.8  AST 14*  ALT 9  ALKPHOS 75  PROT 8.3*  ALBUMIN 3.5    Assessment and Plan:  Periappendiceal abscess.  S/P drain placement yesterday by Dr. 07/28/19.  Repeat CT when output <15 mL daily.  Continue flushes.  Electronically Signed: Deanne Coffer, PA-C 07/28/2019, 1:41 PM    I spent a total of 15 Minutes at the the patient's bedside AND on the patient's hospital floor or unit, greater than 50% of which was counseling/coordinating care for f/u RLQ drain.

## 2019-07-28 NOTE — Progress Notes (Signed)
NG tube inserted per order. Patient tolerated procedure. X-ray to be done shortly.

## 2019-07-28 NOTE — Progress Notes (Signed)
Patient B/P elevated, SBP>160 and DBP >110, pulse in the upper 90's. Patient pain at a 5. Denies CP or SOB. Patient states NG tube is very uncomfortable. Medicated for pain. PA on call notified and new orders received. Will cont to monitor.

## 2019-07-29 LAB — CBC
HCT: 39.9 % (ref 39.0–52.0)
Hemoglobin: 12.8 g/dL — ABNORMAL LOW (ref 13.0–17.0)
MCH: 30.5 pg (ref 26.0–34.0)
MCHC: 32.1 g/dL (ref 30.0–36.0)
MCV: 95.2 fL (ref 80.0–100.0)
Platelets: 396 10*3/uL (ref 150–400)
RBC: 4.19 MIL/uL — ABNORMAL LOW (ref 4.22–5.81)
RDW: 13.3 % (ref 11.5–15.5)
WBC: 10 10*3/uL (ref 4.0–10.5)
nRBC: 0 % (ref 0.0–0.2)

## 2019-07-29 LAB — BASIC METABOLIC PANEL
Anion gap: 14 (ref 5–15)
BUN: 39 mg/dL — ABNORMAL HIGH (ref 6–20)
CO2: 19 mmol/L — ABNORMAL LOW (ref 22–32)
Calcium: 8.6 mg/dL — ABNORMAL LOW (ref 8.9–10.3)
Chloride: 110 mmol/L (ref 98–111)
Creatinine, Ser: 1.31 mg/dL — ABNORMAL HIGH (ref 0.61–1.24)
GFR calc Af Amer: >60 mL/min (ref >60)
GFR calc non Af Amer: >60 mL/min (ref >60)
Glucose, Bld: 114 mg/dL — ABNORMAL HIGH (ref 70–99)
Potassium: 5.1 mmol/L (ref 3.5–5.1)
Sodium: 143 mmol/L (ref 135–145)

## 2019-07-29 MED ORDER — DEXTROSE-NACL 5-0.45 % IV SOLN: INTRAVENOUS | Status: DC

## 2019-07-29 MED ORDER — AMLODIPINE BESYLATE 5 MG PO TABS
5.0000 mg | ORAL_TABLET | Freq: Every day | ORAL | Status: DC
  Administered 2019-07-29 – 2019-07-31 (×3): 5 mg via ORAL
  Filled 2019-07-29 (×3): qty 1

## 2019-07-29 MED ORDER — LABETALOL HCL 5 MG/ML IV SOLN
10.0000 mg | INTRAVENOUS | Status: DC | PRN
  Administered 2019-07-30: 05:00:00 10 mg via INTRAVENOUS
  Filled 2019-07-29: qty 4

## 2019-07-29 MED ORDER — LABETALOL HCL 5 MG/ML IV SOLN
20.0000 mg | Freq: Once | INTRAVENOUS | Status: AC
  Administered 2019-07-29: 04:00:00 20 mg via INTRAVENOUS
  Filled 2019-07-29: qty 4

## 2019-07-29 MED ORDER — METOPROLOL TARTRATE 5 MG/5ML IV SOLN
5.0000 mg | Freq: Four times a day (QID) | INTRAVENOUS | Status: DC | PRN
  Administered 2019-07-29: 10 mg via INTRAVENOUS
  Filled 2019-07-29: qty 10

## 2019-07-29 NOTE — Plan of Care (Signed)
  Problem: Coping: Goal: Level of anxiety will decrease Outcome: Progressing   Problem: Elimination: Goal: Will not experience complications related to bowel motility Outcome: Progressing   Problem: Pain Managment: Goal: General experience of comfort will improve Outcome: Progressing   Problem: Safety: Goal: Ability to remain free from injury will improve Outcome: Progressing   

## 2019-07-29 NOTE — Progress Notes (Signed)
Pt states that he feels like he is hallucinating, potentially due to pain medication.  Also feels like eating something may help. Clear liquid diet ordered. Pt states he is passing gas. Pt encouraged to get up and ambulate. Explained to pt that if he is unable to tolerate clear liquids he will need to go back to NPO, he verbalized understanding.

## 2019-07-29 NOTE — Progress Notes (Signed)
Pt bp elevated 172/105 Md contacted Prn order given for metoprolol   Bp 172/113 apresoline given at 0108  Bp 180/118 Md contacted again not time for current PRN bp meds, one time dose of labetalol given  About 5am BP 169/113 metoprolol given   The lowest pt's bp got this shift was 161/103 at 0537   Pt was given IV pain medication almost every two hours with some relief    Tachycardia also reported to MD

## 2019-07-29 NOTE — Progress Notes (Addendum)
Subjective/Chief Complaint: Feels better this am, no flatus/bm, bp up overnight   Objective: Vital signs in last 24 hours: Temp:  [98 F (36.7 C)-98.6 F (37 C)] 98.6 F (37 C) (03/25 0700) Pulse Rate:  [92-107] 97 (03/25 0700) Resp:  [16-19] 19 (03/25 0700) BP: (158-186)/(96-134) 162/98 (03/25 0700) SpO2:  [95 %-98 %] 98 % (03/25 0700) Last BM Date: 07/23/19  Intake/Output from previous day: 03/24 0701 - 03/25 0700 In: 145 [P.O.:120; I.V.:5] Out: 2200 [Urine:1700; Emesis/NG output:500] Intake/Output this shift: No intake/output data recorded.  Resp: clear to auscultation bilaterally Cardio: regular rate and rhythm GI: much softer and only tender at drain site, few bs, drain serosang  Lab Results:  Recent Labs    07/28/19 0606 07/29/19 0504  WBC 19.5* 10.0  HGB 13.3 12.8*  HCT 41.5 39.9  PLT 400 396   BMET Recent Labs    07/28/19 0606 07/29/19 0504  NA 140 143  K 4.9 5.1  CL 105 110  CO2 23 19*  GLUCOSE 127* 114*  BUN 46* 39*  CREATININE 1.77* 1.31*  CALCIUM 8.7* 8.6*   PT/INR Recent Labs    07/27/19 0506  LABPROT 15.6*  INR 1.3*   ABG No results for input(s): PHART, HCO3 in the last 72 hours.  Invalid input(s): PCO2, PO2  Studies/Results: CT ABDOMEN PELVIS WO CONTRAST  Result Date: 07/28/2019 CLINICAL DATA:  RIGHT lower quadrant abscess secondary to acute appendicitis, post percutaneous drainage yesterday, horrible abdominal pain since procedure from umbilicus to RIGHT lateral abdomen EXAM: CT ABDOMEN AND PELVIS WITHOUT CONTRAST TECHNIQUE: Multidetector CT imaging of the abdomen and pelvis was performed following the standard protocol without IV contrast. Sagittal and coronal MPR images reconstructed from axial data set. Neither oral nor intravenous contrast administered. COMPARISON:  07/27/2019 FINDINGS: Lower chest: Mild bibasilar atelectasis and tiny pleural effusions greater on RIGHT Hepatobiliary: Gallbladder and liver normal appearance  Pancreas: Normal appearance Spleen: Normal appearance Adrenals/Urinary Tract: Adrenal glands, kidneys, ureters, and bladder normal appearance Stomach/Bowel: Again seen enlarged appendix with thickened wall consistent with acute appendicitis. Near complete resolution of periappendiceal abscess collection seen on prior exam post percutaneous drainage, pigtail catheter noted. Gas and stool throughout colon, minimally prominent size. Multiple dilated small bowel loops containing fluid and retained contrast consistent with ileus. Distended stomach containing gas and fluid. Vascular/Lymphatic: Aorta contains multiple atherosclerotic calcifications, normal in caliber. No adenopathy. Reproductive: Unremarkable prostate gland Other: Small amount of free fluid in abdomen/pelvis and perihepatic. No free air. No hernia. Musculoskeletal: Osseous structures unremarkable. IMPRESSION: Acute appendicitis with significant decrease in size of periappendiceal abscess collection post percutaneous drainage. Small amount of free fluid in abdomen/pelvis. Dilated small bowel loops containing fluid and retained contrast consistent with ileus. Bibasilar atelectasis and tiny pleural effusions greater on RIGHT. No new intra-abdominal or intrapelvic abnormalities. Aortic Atherosclerosis (ICD10-I70.0). Electronically Signed   By: Ulyses Southward M.D.   On: 07/28/2019 10:56   DG Abd Portable 1V  Result Date: 07/28/2019 CLINICAL DATA:  Check gastric catheter placement EXAM: PORTABLE ABDOMEN - 1 VIEW COMPARISON:  None FINDINGS: Gastric catheter is noted with the tip in the stomach. Proximal side port is also within the stomach. Scattered small bowel dilatation is noted. No free air is seen. IMPRESSION: Gastric catheter within the stomach. Persistent changes of small-bowel obstruction. Electronically Signed   By: Alcide Clever M.D.   On: 07/28/2019 12:33   CT IMAGE GUIDED DRAINAGE BY PERCUTANEOUS CATHETER  Result Date: 07/27/2019 CLINICAL DATA:   Suspected periappendiceal abscess. Drainage  requested EXAM: CT GUIDED DRAINAGE OF PERITONEAL ABSCESS ANESTHESIA/SEDATION: Intravenous Fentanyl 18mcg and Versed 3mg  were administered as conscious sedation during continuous monitoring of the patient?s level of consciousness and physiological / cardiorespiratory status by the radiology RN, with a total moderate sedation time of 17 minutes. PROCEDURE: The procedure, risks, benefits, and alternatives were explained to the patient. Questions regarding the procedure were encouraged and answered. The patient understands and consents to the procedure. Select axial scans through the lower abdomen were obtained. The right lower quadrant gas and fluid collection was localized and an appropriate skin entry site was determined and marked. The operative field was prepped with chlorhexidinein a sterile fashion, and a sterile drape was applied covering the operative field. A sterile gown and sterile gloves were used for the procedure. Local anesthesia was provided with 1% Lidocaine. Under CT fluoroscopic guidance, 18 gauge trocar needle advanced into the collection. Purulent material returned through the hub. Amplatz wire advanced easily within the collection, position confirmed on CT. Tract dilated to facilitate placement of a 12 French pigtail drain catheter, formed within the central dependent aspect. 20 mL purulent material were aspirated, sent for Gram stain and culture. Catheter secured externally with 0 Prolene suture and StatLock and placed to gravity drain. The patient tolerated the procedure well. COMPLICATIONS: None immediate FINDINGS: Right lower quadrant loculated gas/fluid collection localized. 12 French pigtail drain catheter placed as above. 20 mL purulent aspirate sent for Gram stain and culture. IMPRESSION: 1. Technically successful Right lower quadrant abscess drain catheter placement. Electronically Signed   By: Lucrezia Europe M.D.   On: 07/27/2019 11:25     Anti-infectives: Anti-infectives (From admission, onward)   Start     Dose/Rate Route Frequency Ordered Stop   07/27/19 1200  piperacillin-tazobactam (ZOSYN) IVPB 3.375 g     3.375 g 12.5 mL/hr over 240 Minutes Intravenous Every 8 hours 07/27/19 0403     07/27/19 0315  piperacillin-tazobactam (ZOSYN) IVPB 3.375 g     3.375 g 100 mL/hr over 30 Minutes Intravenous  Once 07/27/19 0305 07/27/19 0401      Assessment/Plan: Perforated appendicitis with abscess -ct with ileus yesterday much better with ng tube in -wbc normal today -hopefully will resolve without surgery -npo can have some ice, iv zosyn -pulm toilet, needs oob and ics Acute kidney injury -Cr improving -continue iv fluids but will decrease HTN -start norvasc with sips -prn labetalol Lovenox, scds  Rolm Bookbinder 07/29/2019   Addendum: started passing flatus, will clamp ng maybe out later today

## 2019-07-29 NOTE — Progress Notes (Signed)
Referring Physician(s): Violeta Gelinas  Supervising Physician: Oley Balm  Patient Status:  [facility] - In-pt  Chief Complaint: Follow up RLQ drain placed 07/27/19 by Dr. Deanne Coffer  Subjective:  Patient reports significant pain from his nose to his abdomen which he states is due to the NG tube, he is frustrated that the tube is no longer to suction but has not been removed. He states he is aware of the indication for the NG tube however he states "I am not like everyone else so they need to listen to me when I say I want it out. My sister told me there's no reason for it to stay." He states that he can tolerate it a little better if he has pain medication every 2 hours but he feels that he is not getting his pain medications on schedule. He reports worsening distention and LUQ and LLQ pain/fullness. He denies any complaints about the drain, bag was just emptied, no pain with palpation of RLQ.  Allergies: Patient has no known allergies.  Medications: Prior to Admission medications   Not on File     Vital Signs: BP (!) 162/98 (BP Location: Left Arm)   Pulse 97   Temp (!) 100.6 F (38.1 C) (Oral)   Resp 19   Ht 6' 1.5" (1.867 m)   Wt 152 lb (68.9 kg)   SpO2 98%   BMI 19.78 kg/m   Physical Exam Vitals and nursing note reviewed.  Constitutional:      General: He is not in acute distress. HENT:     Head: Normocephalic.  Cardiovascular:     Rate and Rhythm: Normal rate.  Pulmonary:     Effort: Pulmonary effort is normal.  Abdominal:     General: There is distension.     Tenderness: There is abdominal tenderness.     Comments: (+) RLQ drain to gravity with scant purulent appearing output (bag was just emptied); no issue with flushing/aspirating. Insertion site unremarkable.  Skin:    General: Skin is warm and dry.  Neurological:     Mental Status: He is alert. Mental status is at baseline.     Imaging: CT ABDOMEN PELVIS WO CONTRAST  Result Date: 07/28/2019 CLINICAL  DATA:  RIGHT lower quadrant abscess secondary to acute appendicitis, post percutaneous drainage yesterday, horrible abdominal pain since procedure from umbilicus to RIGHT lateral abdomen EXAM: CT ABDOMEN AND PELVIS WITHOUT CONTRAST TECHNIQUE: Multidetector CT imaging of the abdomen and pelvis was performed following the standard protocol without IV contrast. Sagittal and coronal MPR images reconstructed from axial data set. Neither oral nor intravenous contrast administered. COMPARISON:  07/27/2019 FINDINGS: Lower chest: Mild bibasilar atelectasis and tiny pleural effusions greater on RIGHT Hepatobiliary: Gallbladder and liver normal appearance Pancreas: Normal appearance Spleen: Normal appearance Adrenals/Urinary Tract: Adrenal glands, kidneys, ureters, and bladder normal appearance Stomach/Bowel: Again seen enlarged appendix with thickened wall consistent with acute appendicitis. Near complete resolution of periappendiceal abscess collection seen on prior exam post percutaneous drainage, pigtail catheter noted. Gas and stool throughout colon, minimally prominent size. Multiple dilated small bowel loops containing fluid and retained contrast consistent with ileus. Distended stomach containing gas and fluid. Vascular/Lymphatic: Aorta contains multiple atherosclerotic calcifications, normal in caliber. No adenopathy. Reproductive: Unremarkable prostate gland Other: Small amount of free fluid in abdomen/pelvis and perihepatic. No free air. No hernia. Musculoskeletal: Osseous structures unremarkable. IMPRESSION: Acute appendicitis with significant decrease in size of periappendiceal abscess collection post percutaneous drainage. Small amount of free fluid in abdomen/pelvis. Dilated small  bowel loops containing fluid and retained contrast consistent with ileus. Bibasilar atelectasis and tiny pleural effusions greater on RIGHT. No new intra-abdominal or intrapelvic abnormalities. Aortic Atherosclerosis (ICD10-I70.0).  Electronically Signed   By: Ulyses Southward M.D.   On: 07/28/2019 10:56   CT ABDOMEN PELVIS WO CONTRAST  Result Date: 07/27/2019 CLINICAL DATA:  Right lower quadrant abdominal pain EXAM: CT ABDOMEN AND PELVIS WITHOUT CONTRAST TECHNIQUE: Multidetector CT imaging of the abdomen and pelvis was performed following the standard protocol without IV contrast. COMPARISON:  None. FINDINGS: Lower chest: The visualized heart size within normal limits. No pericardial fluid/thickening. No hiatal hernia. Minimal bibasilar dependent atelectasis. Hepatobiliary: The liver is normal in density without focal abnormality.The main portal vein is patent. No evidence of calcified gallstones, gallbladder wall thickening or biliary dilatation. Pancreas: Unremarkable. No pancreatic ductal dilatation or surrounding inflammatory changes. Spleen: Normal in size without focal abnormality. Adrenals/Urinary Tract: Both adrenal glands appear normal. The kidneys and collecting system appear normal without evidence of urinary tract calculus or hydronephrosis. Bladder is unremarkable. Stomach/Bowel: The stomach is normal in appearance. There is mildly dilated air and fluid-filled loops of small bowel measuring up to 3 cm without a focal area of narrowing. The distal ileal loops are decompressed. Within the right lower quadrant there is a dilated appendix measuring up to 9 mm in transverse dimension with surrounding mesenteric fat stranding changes. Adjacent to or within the appendix there is a small calcification and small foci of air. Extending from this region there is a loculated fluid and air collection measuring 5.9 x 2.2 cm, series 3, image 58. Surrounding fat stranding changes are seen within the right lower quadrant. Vascular/Lymphatic: There are no enlarged mesenteric, retroperitoneal, or pelvic lymph nodes. Scattered aortic atherosclerotic calcifications are seen without aneurysmal dilatation. Reproductive: The prostate is unremarkable.  Other: No evidence of abdominal wall mass or hernia. Musculoskeletal: No acute or significant osseous findings. IMPRESSION: 1. Findings consistent with acute appendicitis with a probable adjacent small foci of air and periappendiceal abscess measuring 5.9 x 2.2 cm. 2. Mildly dilated proximal small bowel loops, likely due to ileus. 3.  Aortic Atherosclerosis (ICD10-I70.0). Electronically Signed   By: Jonna Clark M.D.   On: 07/27/2019 02:41   DG Chest 2 View  Result Date: 07/26/2019 CLINICAL DATA:  Short of breath, abdominal pain for 2 days, nausea and vomiting EXAM: CHEST - 2 VIEW COMPARISON:  None. FINDINGS: Frontal and lateral views of the chest demonstrate an unremarkable cardiac silhouette. There is increased density in the left paratracheal region with deviation of the airway to the right, likely due to an enlarged thyroid. Please correlate with physical exam findings. No airspace disease, effusion, or pneumothorax. No acute bony abnormalities. IMPRESSION: 1. No acute intrathoracic process. 2. Likely enlarged thyroid as above. Electronically Signed   By: Sharlet Salina M.D.   On: 07/26/2019 17:55   DG Abd Portable 1V  Result Date: 07/28/2019 CLINICAL DATA:  Check gastric catheter placement EXAM: PORTABLE ABDOMEN - 1 VIEW COMPARISON:  None FINDINGS: Gastric catheter is noted with the tip in the stomach. Proximal side port is also within the stomach. Scattered small bowel dilatation is noted. No free air is seen. IMPRESSION: Gastric catheter within the stomach. Persistent changes of small-bowel obstruction. Electronically Signed   By: Alcide Clever M.D.   On: 07/28/2019 12:33   CT IMAGE GUIDED DRAINAGE BY PERCUTANEOUS CATHETER  Result Date: 07/27/2019 CLINICAL DATA:  Suspected periappendiceal abscess. Drainage requested EXAM: CT GUIDED DRAINAGE OF PERITONEAL ABSCESS ANESTHESIA/SEDATION:  Intravenous Fentanyl and Versed 3mg  were administered as conscious sedation during continuous monitoring of  the patient?s level of consciousness and physiological / cardiorespiratory status by the radiology RN, with a total moderate sedation time of 17 minutes. PROCEDURE: The procedure, risks, benefits, and alternatives were explained to the patient. Questions regarding the procedure were encouraged and answered. The patient understands and consents to the procedure. Select axial scans through the lower abdomen were obtained. The right lower quadrant gas and fluid collection was localized and an appropriate skin entry site was determined and marked. The operative field was prepped with chlorhexidinein a sterile fashion, and a sterile drape was applied covering the operative field. A sterile gown and sterile gloves were used for the procedure. Local anesthesia was provided with 1% Lidocaine. Under CT fluoroscopic guidance, 18 gauge trocar needle advanced into the collection. Purulent material returned through the hub. Amplatz wire advanced easily within the collection, position confirmed on CT. Tract dilated to facilitate placement of a 12 French pigtail drain catheter, formed within the central dependent aspect. 20 mL purulent material were aspirated, sent for Gram stain and culture. Catheter secured externally with 0 Prolene suture and StatLock and placed to gravity drain. The patient tolerated the procedure well. COMPLICATIONS: None immediate FINDINGS: Right lower quadrant loculated gas/fluid collection localized. 12 French pigtail drain catheter placed as above. 20 mL purulent aspirate sent for Gram stain and culture. IMPRESSION: 1. Technically successful Right lower quadrant abscess drain catheter placement. Electronically Signed   By: M.D.   On: 07/27/2019 11:25    Labs:  CBC: Recent Labs    07/26/19 1720 07/28/19 0606 07/29/19 0504  WBC 25.8* 19.5* 10.0  HGB 15.0 13.3 12.8*  HCT 45.5 41.5 39.9  PLT 352 400 396    COAGS: Recent Labs    07/27/19 0506  INR 1.3*    BMP: Recent Labs     07/26/19 1720 07/27/19 1144 07/28/19 0606 07/29/19 0504  NA 140 141 140 143  K 4.9 4.9 4.9 5.1  CL 99 104 105 110  CO2 24 23 23  19*  GLUCOSE 158* 90 127* 114*  BUN 49* 56* 46* 39*  CALCIUM 9.5 8.6* 8.7* 8.6*  CREATININE 2.91* 2.49* 1.77* 1.31*  GFRNONAA 24* 29* 44* >60  GFRAA 28* 34* 51* >60    LIVER FUNCTION TESTS: Recent Labs    07/26/19 1720  BILITOT 0.8  AST 14*  ALT 9  ALKPHOS 75  PROT 8.3*  ALBUMIN 3.5    Assessment and Plan:  49 y/o M s/p RLQ drain placement for periappendiceal abscess on 07/27/19 in IR seen today for drain follow up.  Patient denies complaints regarding the drain but does endorse pain from his nose to his LUQ/LLQ as well abdominal distention. He asks repeatedly that the NG be removed during my visit - we discussed indication for NG and that general surgery will decide when it is appropriate to remove which he understands. Unclear documentation of drain output - under I/O there is 20 cc output yesterday and 10 cc listed under "other." Will clarify with floor staff. Insertion site unremarkable, no issues with flushing/aspiration.  Continue TID flushes with 5 cc NS, record output Qshift, dressing changes QD or PRN if soiled, call IR if difficulty flushing or sudden decrease in output.   Plan for repeat CT/possible injection once output <10 mL/QD (excluding flush) - if patient is to be discharged prior to this occurring they will need to follow up with IR clinic  as an outpatient typically 10-14 days post d/c, IR scheduler will call patient with date/time. Upon d/c drain to be flushed QD with 5 cc NS (will need rx from primary team at d/c), record output QD, dressing changes every 2-3 days or sooner if soiled.   IR will continue to follow - please call with questions or concerns.  Electronically Signed: Joaquim Nam, PA-C 07/29/2019, 11:26 AM   I spent a total of 15 Minutes at the the patient's bedside AND on the patient's hospital floor or  unit, greater than 50% of which was counseling/coordinating care for RLQ abscess drain follow up.

## 2019-07-30 ENCOUNTER — Inpatient Hospital Stay (HOSPITAL_COMMUNITY): Payer: No Typology Code available for payment source

## 2019-07-30 ENCOUNTER — Encounter (HOSPITAL_COMMUNITY): Payer: Self-pay | Admitting: General Surgery

## 2019-07-30 DIAGNOSIS — I10 Essential (primary) hypertension: Secondary | ICD-10-CM | POA: Diagnosis not present

## 2019-07-30 LAB — BASIC METABOLIC PANEL
Anion gap: 13 (ref 5–15)
BUN: 26 mg/dL — ABNORMAL HIGH (ref 6–20)
CO2: 22 mmol/L (ref 22–32)
Calcium: 8.6 mg/dL — ABNORMAL LOW (ref 8.9–10.3)
Chloride: 106 mmol/L (ref 98–111)
Creatinine, Ser: 1.1 mg/dL (ref 0.61–1.24)
GFR calc Af Amer: >60 mL/min (ref >60)
GFR calc non Af Amer: >60 mL/min (ref >60)
Glucose, Bld: 131 mg/dL — ABNORMAL HIGH (ref 70–99)
Potassium: 4.4 mmol/L (ref 3.5–5.1)
Sodium: 141 mmol/L (ref 135–145)

## 2019-07-30 LAB — AEROBIC/ANAEROBIC CULTURE W GRAM STAIN (SURGICAL/DEEP WOUND)

## 2019-07-30 MED ORDER — ACETAMINOPHEN 500 MG PO TABS
1000.0000 mg | ORAL_TABLET | Freq: Four times a day (QID) | ORAL | Status: DC
  Administered 2019-07-30 – 2019-08-05 (×20): 1000 mg via ORAL
  Filled 2019-07-30 (×22): qty 2

## 2019-07-30 MED ORDER — METOPROLOL TARTRATE 25 MG PO TABS
25.0000 mg | ORAL_TABLET | Freq: Two times a day (BID) | ORAL | Status: DC
  Administered 2019-07-30 – 2019-08-05 (×13): 25 mg via ORAL
  Filled 2019-07-30 (×13): qty 1

## 2019-07-30 MED ORDER — OXYCODONE HCL 5 MG PO TABS
5.0000 mg | ORAL_TABLET | ORAL | Status: DC | PRN
  Administered 2019-07-30 – 2019-08-02 (×3): 5 mg via ORAL
  Filled 2019-07-30 (×4): qty 1

## 2019-07-30 MED ORDER — IBUPROFEN 200 MG PO TABS: 600.0000 mg | ORAL_TABLET | Freq: Three times a day (TID) | ORAL | Status: DC

## 2019-07-30 MED ORDER — SIMETHICONE 80 MG PO CHEW
80.0000 mg | CHEWABLE_TABLET | Freq: Four times a day (QID) | ORAL | Status: DC | PRN
  Administered 2019-07-31: 80 mg via ORAL
  Filled 2019-07-30: qty 1

## 2019-07-30 MED ORDER — METHOCARBAMOL 500 MG PO TABS
500.0000 mg | ORAL_TABLET | Freq: Three times a day (TID) | ORAL | Status: DC
  Administered 2019-07-30 – 2019-08-05 (×16): 500 mg via ORAL
  Filled 2019-07-30 (×17): qty 1

## 2019-07-30 MED ORDER — HYDROMORPHONE HCL 1 MG/ML IJ SOLN
0.5000 mg | INTRAMUSCULAR | Status: DC | PRN
  Administered 2019-07-30 – 2019-08-03 (×4): 0.5 mg via INTRAVENOUS
  Filled 2019-07-30 (×4): qty 1

## 2019-07-30 MED ORDER — POLYETHYLENE GLYCOL 3350 17 G PO PACK
17.0000 g | PACK | Freq: Every day | ORAL | Status: DC
  Administered 2019-07-30 – 2019-08-03 (×3): 17 g via ORAL
  Filled 2019-07-30 (×8): qty 1

## 2019-07-30 NOTE — Progress Notes (Signed)
   07/30/19 1357  Provider Notification  Provider Name/Title  General Surgery team  Date Provider Notified 07/30/19  Time Provider Notified 1357  Notification Type Call  Notification Reason Change in status;Other (Comment) (c/o increased abdominal pain)   Patient reports pain 10/10, also states it feels worse than when he was admitted. Patient states he feels that he is constipated but is passing gas. "My pain is so bad, I can't even move."   Call service placed page to Dr Dwain Sarna to return call to nursing staff.

## 2019-07-30 NOTE — Plan of Care (Signed)
  Problem: Nutrition: Goal: Adequate nutrition will be maintained Outcome: Progressing   Problem: Pain Managment: Goal: General experience of comfort will improve Outcome: Progressing   

## 2019-07-30 NOTE — Consult Note (Signed)
Medical Consultation   Russell Wells  KGU:[id number]  DOB: 02-08-1971  DOA: 07/26/2019  PCP: Patient, No Pcp Per - moved to [place] from [place] about 10 months ago  Outpatient Specialists: None   Requesting physician: Dwain Sarna  Reason for consultation: Healthy patient with perf appy - has a drain and an ileus.  Started on norvasc yesterday, ?undiagnosed HTN.  Needs a PCP at discharge.AKI has normalized.   History of Present Illness: Russell Wells is an 49 y.o. male without known PMH who presented on 3/22 with perforated appendicitis.  He reports that he works overnight at [employer] and he developed sharp pain in his abdomen.  He drank a cold drink but the sharp pain in his RLQ was still ongoing.  He finished working, went home, and took Advil and tried to rest.  He slept all day and it was a little better than night when he got up.  About 3AM "all hell broke loose."  The pain was out of control.  He finally came to the ER on 3/23 and they put a drain in.  Since the drain was placed, intermittent pain that is well controlled with the medication.  He was NPO for 2 days, yesterday he ate something with clear liquids and plan for advancing diet today.  He had physicals every year and was told to watch his BP.  He has never been on medication.  No h/o chronic medical problems.    Review of Systems:  ROS As per HPI otherwise 10 point review of systems negative.    Past Medical History: Past Medical History:  Diagnosis Date  . Essential hypertension     Past Surgical History: History reviewed. No pertinent surgical history.   Allergies:  No Known Allergies   Social History:  reports that he has never smoked. He has never used smokeless tobacco. He reports current alcohol use. He reports previous drug use.   Family History: Family History  Problem Relation Age of Onset  . Hypertension Mother   . Hypertension Father       Physical Exam: Vitals:   07/30/19 0734 07/30/19 1403 07/30/19 1555 07/30/19 1640  BP: (!) 168/102 (!) 170/70 (!) 167/118 (!) 158/100  Pulse: 91 98    Resp: 17 16    Temp: 98.1 F (36.7 C) 98.6 F (37 C)    TempSrc: Oral Oral    SpO2: 97% 98%    Weight:      Height:        Constitutional: Alert and awake, oriented x3, not in any acute distress. Frustrated by pain and lack of pain medication. Eyes: EOMI, irises appear normal, anicteric sclera,  ENMT: external ears and nose appear normal, normal hearing, Lips appear normal Neck: neck appears normal, no masses, normal ROM, no thyromegaly, no JVD  CVS: S1-S2 clear, no murmur rubs or gallops, no LE edema, normal pedal pulses  Respiratory:  clear to auscultation bilaterally, no wheezing, rales or rhonchi. Respiratory effort normal. No accessory muscle use.  Abdomen: mildly distended, drain present in RLQ  Musculoskeletal: : no cyanosis, clubbing or edema noted bilaterally Neuro: Cranial nerves II-XII intact, strength, sensation, reflexes Psych: judgement and insight appear normal, stable mood and affect, mental status Skin: no rashes or lesions or ulcers, no induration or nodules    Data reviewed:  I have personally reviewed the recent labs and imaging studies  Pertinent Labs:   Glucose 131  BUN 26/Creatinine 1.10/GFR >60    Inpatient Medications:   Scheduled Meds: . acetaminophen  1,000 mg Oral Q6H  . amLODipine  5 mg Oral Daily  . enoxaparin (LOVENOX) injection  40 mg Subcutaneous Daily  . methocarbamol  500 mg Oral TID  . metoprolol tartrate  25 mg Oral BID  . polyethylene glycol  17 g Oral Daily  . sodium chloride flush  5 mL Intracatheter Q8H   Continuous Infusions: . dextrose 5 % and 0.45% NaCl 50 mL/hr at 07/30/19 0909  . piperacillin-tazobactam (ZOSYN)  IV 3.375 g (07/30/19 1209)     Radiological Exams on Admission: DG Abd Portable 1V  Result Date: 07/30/2019 CLINICAL DATA:  Perforated appendicitis with abscess. Abdominal pain. Ileus.  EXAM: PORTABLE ABDOMEN - 1 VIEW COMPARISON:  07/28/2019.  CT 07/28/2019. FINDINGS: Interim removal of NG tube. Pigtail drainage catheter noted over the right lower quadrant. Persistent unchanged dilated loops of small bowel again noted. Colonic gas pattern is normal. No free air. Pelvic calcifications consistent phleboliths. No acute bony abnormality. IMPRESSION: 1. Interim removal of NG tube. Pigtail drainage catheter noted over the right lower quadrant. 2. Persistent unchanged dilated loops of small bowel noted. Colonic gas pattern is normal. Adynamic ileus could present this fashion. Small-bowel obstruction cannot be excluded. Continued follow-up exam suggested. Electronically Signed   By: Marcello Moores  Register   On: 07/30/2019 06:59    Impression/Recommendations Principal Problem:   Perforated appendicitis Active Problems:   Essential hypertension  Perforated appendicitis -Patient admitted by surgery with placement of pigtail drainage catheter in RLQ -Developed a post-operative ileus; NG tube now out and tolerating clears this AM -Continues on Zosyn -Pain management with PO medications now -Advancing diet -Patient should be encouraged to get OOB and ambulate -He may be able to d/c soon with plan for outpatient surgery f/u and likely subsequent appendectomy  AKI -Prerenal azotemia in the setting of intraabdominal infection -Resolved  HTN -No prior formal diagnosis but he has been told to monitor his BP for years due to elevated BPs and +FH -Suspect that some of his inpatient BP elevation is related to suboptimal pain control - when I went to evaluate him, he reported that he had been waiting for 2-3 hours for pain meds -He was started on Norvasc yesterday and I added PO Lopressor BID today -Continue prn Hydralazine -Will need PCP for outpatient f/u - will request [facility] team consult    Thank you for this consultation.  Our [facility] hospitalist team will follow the patient with you.   Time  Spent: 40 minutes  Karmen Bongo M.D. [facility] 07/30/2019, 6:16 PM

## 2019-07-30 NOTE — Progress Notes (Signed)
Subjective/Chief Complaint: Ng out, tol clears, having flatus, no bm yet, oob, voiding well   Objective: Vital signs in last 24 hours: Temp:  [98 F (36.7 C)-100.6 F (38.1 C)] 98.1 F (36.7 C) (03/26 0734) Pulse Rate:  [90-108] 91 (03/26 0734) Resp:  [15-18] 17 (03/26 0734) BP: (160-177)/(95-116) 168/102 (03/26 0734) SpO2:  [96 %-99 %] 97 % (03/26 0734) Last BM Date: 07/23/19  Intake/Output from previous day: 03/25 0701 - 03/26 0700 In: 970.3 [I.V.:618.3; IV Piggyback:341.9] Out: 3200 [Urine:3000; Emesis/NG output:200] Intake/Output this shift: No intake/output data recorded.  General appearance: alert and no distress Resp: clear to auscultation bilaterally Cardio: regular rate and rhythm GI: drain with purulent fluid, soft nondistended tender only at drain site  Lab Results:  Recent Labs    07/28/19 0606 07/29/19 0504  WBC 19.5* 10.0  HGB 13.3 12.8*  HCT 41.5 39.9  PLT 400 396   BMET Recent Labs    07/29/19 0504 07/30/19 0330  NA 143 141  K 5.1 4.4  CL 110 106  CO2 19* 22  GLUCOSE 114* 131*  BUN 39* 26*  CREATININE 1.31* 1.10  CALCIUM 8.6* 8.6*   PT/INR No results for input(s): LABPROT, INR in the last 72 hours. ABG No results for input(s): PHART, HCO3 in the last 72 hours.  Invalid input(s): PCO2, PO2  Studies/Results: CT ABDOMEN PELVIS WO CONTRAST  Result Date: 07/28/2019 CLINICAL DATA:  RIGHT lower quadrant abscess secondary to acute appendicitis, post percutaneous drainage yesterday, horrible abdominal pain since procedure from umbilicus to RIGHT lateral abdomen EXAM: CT ABDOMEN AND PELVIS WITHOUT CONTRAST TECHNIQUE: Multidetector CT imaging of the abdomen and pelvis was performed following the standard protocol without IV contrast. Sagittal and coronal MPR images reconstructed from axial data set. Neither oral nor intravenous contrast administered. COMPARISON:  07/27/2019 FINDINGS: Lower chest: Mild bibasilar atelectasis and tiny pleural  effusions greater on RIGHT Hepatobiliary: Gallbladder and liver normal appearance Pancreas: Normal appearance Spleen: Normal appearance Adrenals/Urinary Tract: Adrenal glands, kidneys, ureters, and bladder normal appearance Stomach/Bowel: Again seen enlarged appendix with thickened wall consistent with acute appendicitis. Near complete resolution of periappendiceal abscess collection seen on prior exam post percutaneous drainage, pigtail catheter noted. Gas and stool throughout colon, minimally prominent size. Multiple dilated small bowel loops containing fluid and retained contrast consistent with ileus. Distended stomach containing gas and fluid. Vascular/Lymphatic: Aorta contains multiple atherosclerotic calcifications, normal in caliber. No adenopathy. Reproductive: Unremarkable prostate gland Other: Small amount of free fluid in abdomen/pelvis and perihepatic. No free air. No hernia. Musculoskeletal: Osseous structures unremarkable. IMPRESSION: Acute appendicitis with significant decrease in size of periappendiceal abscess collection post percutaneous drainage. Small amount of free fluid in abdomen/pelvis. Dilated small bowel loops containing fluid and retained contrast consistent with ileus. Bibasilar atelectasis and tiny pleural effusions greater on RIGHT. No new intra-abdominal or intrapelvic abnormalities. Aortic Atherosclerosis (ICD10-I70.0). Electronically Signed   By: Lavonia Dana M.D.   On: 07/28/2019 10:56   DG Abd Portable 1V  Result Date: 07/30/2019 CLINICAL DATA:  Perforated appendicitis with abscess. Abdominal pain. Ileus. EXAM: PORTABLE ABDOMEN - 1 VIEW COMPARISON:  07/28/2019.  CT 07/28/2019. FINDINGS: Interim removal of NG tube. Pigtail drainage catheter noted over the right lower quadrant. Persistent unchanged dilated loops of small bowel again noted. Colonic gas pattern is normal. No free air. Pelvic calcifications consistent phleboliths. No acute bony abnormality. IMPRESSION: 1. Interim  removal of NG tube. Pigtail drainage catheter noted over the right lower quadrant. 2. Persistent unchanged dilated loops of small bowel  noted. Colonic gas pattern is normal. Adynamic ileus could present this fashion. Small-bowel obstruction cannot be excluded. Continued follow-up exam suggested. Electronically Signed   By: Maisie Fus  Register   On: 07/30/2019 06:59   DG Abd Portable 1V  Result Date: 07/28/2019 CLINICAL DATA:  Check gastric catheter placement EXAM: PORTABLE ABDOMEN - 1 VIEW COMPARISON:  None FINDINGS: Gastric catheter is noted with the tip in the stomach. Proximal side port is also within the stomach. Scattered small bowel dilatation is noted. No free air is seen. IMPRESSION: Gastric catheter within the stomach. Persistent changes of small-bowel obstruction. Electronically Signed   By: Alcide Clever M.D.   On: 07/28/2019 12:33    Anti-infectives: Anti-infectives (From admission, onward)   Start     Dose/Rate Route Frequency Ordered Stop   07/27/19 1200  piperacillin-tazobactam (ZOSYN) IVPB 3.375 g     3.375 g 12.5 mL/hr over 240 Minutes Intravenous Every 8 hours 07/27/19 0403     07/27/19 0315  piperacillin-tazobactam (ZOSYN) IVPB 3.375 g     3.375 g 100 mL/hr over 30 Minutes Intravenous  Once 07/27/19 0305 07/27/19 0401      Assessment/Plan: Perforated appendicitis with abscess -ct with ileus after drain placement, now clniically resolved, ng out, tol clears, adat -wbc normal yesterday -hopefully will resolve without surgery -continue zosyn, will likely switch to orals tomorrow and may be able to go home with IR follow up for drain -pulm toilet, needs oob and ics -I discussed possible interval appy and will see back in office once discharged Acute kidney injury -Cr normal today -decrease iv fluids, recheck in am, avoid nephrotoxic agents HTN -start norvasc with sips -prn labetalol -will ask [facility] to see for likely new diagnosis HTN not treated before Lovenox,  scds   Emelia Loron 07/30/2019

## 2019-07-30 NOTE — Care Management (Signed)
Case Management spoke with the patient in regards to getting a primary care physician.  Patient given [facility] number information sheet and placed this information in the AVS for discharge.  Patient recently moved here from out of town.

## 2019-07-31 LAB — CBC
HCT: 37.9 % — ABNORMAL LOW (ref 39.0–52.0)
Hemoglobin: 12.3 g/dL — ABNORMAL LOW (ref 13.0–17.0)
MCH: 30.4 pg (ref 26.0–34.0)
MCHC: 32.5 g/dL (ref 30.0–36.0)
MCV: 93.6 fL (ref 80.0–100.0)
Platelets: 398 10*3/uL (ref 150–400)
RBC: 4.05 MIL/uL — ABNORMAL LOW (ref 4.22–5.81)
RDW: 13.3 % (ref 11.5–15.5)
WBC: 8.7 10*3/uL (ref 4.0–10.5)
nRBC: 0 % (ref 0.0–0.2)

## 2019-07-31 LAB — BASIC METABOLIC PANEL
Anion gap: 10 (ref 5–15)
BUN: 20 mg/dL (ref 6–20)
CO2: 25 mmol/L (ref 22–32)
Calcium: 8.7 mg/dL — ABNORMAL LOW (ref 8.9–10.3)
Chloride: 103 mmol/L (ref 98–111)
Creatinine, Ser: 0.98 mg/dL (ref 0.61–1.24)
GFR calc Af Amer: >60 mL/min (ref >60)
GFR calc non Af Amer: >60 mL/min (ref >60)
Glucose, Bld: 143 mg/dL — ABNORMAL HIGH (ref 70–99)
Potassium: 4.6 mmol/L (ref 3.5–5.1)
Sodium: 138 mmol/L (ref 135–145)

## 2019-07-31 MED ORDER — AMLODIPINE BESYLATE 10 MG PO TABS
10.0000 mg | ORAL_TABLET | Freq: Every day | ORAL | Status: DC
  Administered 2019-08-01 – 2019-08-05 (×5): 10 mg via ORAL
  Filled 2019-07-31 (×5): qty 1

## 2019-07-31 MED ORDER — AMLODIPINE BESYLATE 5 MG PO TABS
5.0000 mg | ORAL_TABLET | Freq: Once | ORAL | Status: AC
  Administered 2019-07-31: 14:00:00 5 mg via ORAL
  Filled 2019-07-31: qty 1

## 2019-07-31 MED ORDER — HYDRALAZINE HCL 20 MG/ML IJ SOLN
10.0000 mg | Freq: Four times a day (QID) | INTRAMUSCULAR | Status: DC | PRN
  Administered 2019-08-01: 10 mg via INTRAVENOUS
  Filled 2019-07-31 (×2): qty 1

## 2019-07-31 NOTE — Plan of Care (Signed)

## 2019-07-31 NOTE — Plan of Care (Signed)

## 2019-07-31 NOTE — Progress Notes (Signed)
   Subjective/Chief Complaint: Tolerated clears but not much appetite   Objective: Vital signs in last 24 hours: Temp:  [97.7 F (36.5 C)-98.6 F (37 C)] 97.7 F (36.5 C) (03/27 0854) Pulse Rate:  [84-98] 98 (03/27 0854) Resp:  [16] 16 (03/26 1403) BP: (151-170)/(70-118) 151/101 (03/27 0854) SpO2:  [97 %-99 %] 97 % (03/27 0854) Last BM Date: 07/31/19  Intake/Output from previous day: 03/26 0701 - 03/27 0700 In: 1504 [P.O.:240; I.V.:1204.9; IV Piggyback:59.2] Out: 145 [Urine:125; Drains:20] Intake/Output this shift: Total I/O In: 245 [P.O.:240; Other:5] Out: -   General appearance: cooperative Resp: clear to auscultation bilaterally Cardio: regular rate and rhythm GI: soft, min tenderness RLQ, drain with output  Lab Results:  Recent Labs    07/29/19 0504 07/31/19 0024  WBC 10.0 8.7  HGB 12.8* 12.3*  HCT 39.9 37.9*  PLT 396 398   BMET Recent Labs    07/30/19 0330 07/31/19 0024  NA 141 138  K 4.4 4.6  CL 106 103  CO2 22 25  GLUCOSE 131* 143*  BUN 26* 20  CREATININE 1.10 0.98  CALCIUM 8.6* 8.7*   PT/INR No results for input(s): LABPROT, INR in the last 72 hours. ABG No results for input(s): PHART, HCO3 in the last 72 hours.  Invalid input(s): PCO2, PO2  Studies/Results: DG Abd Portable 1V  Result Date: 07/30/2019 CLINICAL DATA:  Perforated appendicitis with abscess. Abdominal pain. Ileus. EXAM: PORTABLE ABDOMEN - 1 VIEW COMPARISON:  07/28/2019.  CT 07/28/2019. FINDINGS: Interim removal of NG tube. Pigtail drainage catheter noted over the right lower quadrant. Persistent unchanged dilated loops of small bowel again noted. Colonic gas pattern is normal. No free air. Pelvic calcifications consistent phleboliths. No acute bony abnormality. IMPRESSION: 1. Interim removal of NG tube. Pigtail drainage catheter noted over the right lower quadrant. 2. Persistent unchanged dilated loops of small bowel noted. Colonic gas pattern is normal. Adynamic ileus could  present this fashion. Small-bowel obstruction cannot be excluded. Continued follow-up exam suggested. Electronically Signed   By: Maisie Fus  Register   On: 07/30/2019 06:59    Anti-infectives: Anti-infectives (From admission, onward)   Start     Dose/Rate Route Frequency Ordered Stop   07/27/19 1200  piperacillin-tazobactam (ZOSYN) IVPB 3.375 g     3.375 g 12.5 mL/hr over 240 Minutes Intravenous Every 8 hours 07/27/19 0403     07/27/19 0315  piperacillin-tazobactam (ZOSYN) IVPB 3.375 g     3.375 g 100 mL/hr over 30 Minutes Intravenous  Once 07/27/19 0305 07/27/19 0401      Assessment/Plan: Perforated appendicitis with abscess -ct with ileus after drain placement, now clniically resolved, ng out, tol clears, adat -wbc normal  -Zosyn -if tolerates diet plan PO ABX and home tomorrow - Dr. Dwain Sarna discussed possible interval appy and will see back in office once discharged Acute kidney injury -resolved HTN -norvasc and metoprolol per TRH VTE -Lovenox, scds  LOS: 4 days    Liz Malady 07/31/2019

## 2019-07-31 NOTE — Progress Notes (Signed)
PROGRESS NOTE    Russell Wells  JIR:[id number] DOB: 10/21/1970 DOA: 07/26/2019 PCP: Patient, No Pcp Per   Brief Narrative:  Russell Wells is an 49 y.o. male without known PMH who presented on 3/22 with perforated appendicitis.  He reports that he works overnight at [employer] and he developed sharp pain in his abdomen.  He drank Jacques Willingham cold drink but the sharp pain in his RLQ was still ongoing.  He finished working, went home, and took Advil and tried to rest.  He slept all day and it was [name] little better than night when he got up.  About 3AM "all hell broke loose."  The pain was out of control.  He finally came to the ER on 3/23 and they put Zarrah Loveland drain in.  Since the drain was placed, intermittent pain that is well controlled with the medication.  He was NPO for 2 days, yesterday he ate something with clear liquids and plan for advancing diet today.  He had physicals every year and was told to watch his BP.  He has never been on medication.  No h/o chronic medical problems.  Assessment & Plan:   Principal Problem:   Perforated appendicitis Active Problems:   Essential hypertension  Perforated appendicitis -Patient admitted by surgery with placement of pigtail drainage catheter in RLQ -Developed [name] post-operative ileus; NG tube now out and currently on regular diet - cx with klebsiella pneumoniae (resistant to ampicillin) and strep constellatus (intermediate to penicillin) -Continues on Zosyn -Pain management with PO medications now -Advancing diet -Patient should be encouraged to get OOB and ambulate -He may be able to d/c soon with plan for outpatient surgery f/u and likely subsequent appendectomy  AKI -Prerenal azotemia in the setting of intraabdominal infection -Resolved  HTN -No prior formal diagnosis but he has been told to monitor his BP for years due to elevated BPs and +FH -Pain currently seems well controlled -Increase norvasc to 10 mg, continue metoprolol 25 mg BID.  If  BP's continue to be elevated would add thiazide or ARB/ACEi.  Will follow.  -Continue prn Hydralazine -Will need PCP for outpatient f/u - will request TOC team consult  DVT prophylaxis: lovenox Code Status: full Family Communication: none at bedside Disposition Plan:  . Patient came from: home            . Anticipated d/c place:home . Barriers to d/c OR conditions which need to be met to effect Brealyn Baril safe d/c: discharge by surgery, primary team   Consultants:   Surgery is primary  Procedures:  RLQ abscess drain catheter placement on 3/23 by IR  Antimicrobials:  Anti-infectives (From admission, onward)   Start     Dose/Rate Route Frequency Ordered Stop   07/27/19 1200  piperacillin-tazobactam (ZOSYN) IVPB 3.375 g     3.375 g 12.5 mL/hr over 240 Minutes Intravenous Every 8 hours 07/27/19 0403     07/27/19 0315  piperacillin-tazobactam (ZOSYN) IVPB 3.375 g     3.375 g 100 mL/hr over 30 Minutes Intravenous  Once 07/27/19 0305 07/27/19 0401     Subjective: No pain Feels well  Objective: Vitals:   07/30/19 2037 07/31/19 0316 07/31/19 0854 07/31/19 1305  BP: (!) 163/110 (!) 168/110 (!) 151/101 (!) 184/109  Pulse: 96 84 98 92  Resp:    18  Temp: 98.4 F (36.9 C) 98.4 F (36.9 C) 97.7 F (36.5 C) 98.2 F (36.8 C)  TempSrc: Oral Oral Oral Oral  SpO2: 97% 99% 97% 100%  Weight:  Height:        Intake/Output Summary (Last 24 hours) at 07/31/2019 1325 Last data filed at 07/31/2019 1013 Gross per 24 hour  Intake 1294.03 ml  Output 145 ml  Net 1149.03 ml   Filed Weights   07/26/19 1709  Weight: 68.9 kg    Examination:  General exam: Appears calm and comfortable  Respiratory system: Clear to auscultation. Respiratory effort normal. Cardiovascular system: S1 & S2 heard, RRR.  Gastrointestinal system: Abdomen is nondistended, soft and nontender. Drain in place. Central nervous system: Alert and oriented. No focal neurological deficits. Extremities: no LEE Skin: No  rashes, lesions or ulcers Psychiatry: Judgement and insight appear normal. Mood & affect appropriate.     Data Reviewed: I have personally reviewed following labs and imaging studies  CBC: Recent Labs  Lab 07/26/19 1720 07/28/19 0606 07/29/19 0504 07/31/19 0024  WBC 25.8* 19.5* 10.0 8.7  HGB 15.0 13.3 12.8* 12.3*  HCT 45.5 41.5 39.9 37.9*  MCV 95.0 95.2 95.2 93.6  PLT 352 400 396 161   Basic Metabolic Panel: Recent Labs  Lab 07/27/19 1144 07/28/19 0606 07/29/19 0504 07/30/19 0330 07/31/19 0024  NA 141 140 143 141 138  K 4.9 4.9 5.1 4.4 4.6  CL 104 105 110 106 103  CO2 23 23 19* 22 25  GLUCOSE 90 127* 114* 131* 143*  BUN 56* 46* 39* 26* 20  CREATININE 2.49* 1.77* 1.31* 1.10 0.98  CALCIUM 8.6* 8.7* 8.6* 8.6* 8.7*   GFR: Estimated Creatinine Clearance: 88.9 mL/min (by C-G formula based on SCr of 0.98 mg/dL). Liver Function Tests: Recent Labs  Lab 07/26/19 1720  AST 14*  ALT 9  ALKPHOS 75  BILITOT 0.8  PROT 8.3*  ALBUMIN 3.5   Recent Labs  Lab 07/26/19 1720  LIPASE 21   No results for input(s): AMMONIA in the last 168 hours. Coagulation Profile: Recent Labs  Lab 07/27/19 0506  INR 1.3*   Cardiac Enzymes: No results for input(s): CKTOTAL, CKMB, CKMBINDEX, TROPONINI in the last 168 hours. BNP (last 3 results) No results for input(s): PROBNP in the last 8760 hours. HbA1C: No results for input(s): HGBA1C in the last 72 hours. CBG: No results for input(s): GLUCAP in the last 168 hours. Lipid Profile: No results for input(s): CHOL, HDL, LDLCALC, TRIG, CHOLHDL, LDLDIRECT in the last 72 hours. Thyroid Function Tests: No results for input(s): TSH, T4TOTAL, FREET4, T3FREE, THYROIDAB in the last 72 hours. Anemia Panel: No results for input(s): VITAMINB12, FOLATE, FERRITIN, TIBC, IRON, RETICCTPCT in the last 72 hours. Sepsis Labs: No results for input(s): PROCALCITON, LATICACIDVEN in the last 168 hours.  Recent Results (from the past 240 hour(s))    Respiratory Panel by RT PCR (Flu Kynnedi Zweig&B, Covid) - Nasopharyngeal Swab     Status: None   Collection Time: 07/27/19  3:06 AM   Specimen: Nasopharyngeal Swab  Result Value Ref Range Status   SARS Coronavirus 2 by RT PCR NEGATIVE NEGATIVE Final    Comment: (NOTE) SARS-CoV-2 target nucleic acids are NOT DETECTED. The SARS-CoV-2 RNA is generally detectable in upper respiratoy specimens during the acute phase of infection. The lowest concentration of SARS-CoV-2 viral copies this assay can detect is 131 copies/mL. Andrea Ferrer negative result does not preclude SARS-Cov-2 infection and should not be used as the sole basis for treatment or other patient management decisions. Imraan Wendell negative result may occur with  improper specimen collection/handling, submission of specimen other than nasopharyngeal swab, presence of viral mutation(s) within the areas targeted by this assay,  and inadequate number of viral copies (<131 copies/mL). Hiep Ollis negative result must be combined with clinical observations, patient history, and epidemiological information. The expected result is Negative. Fact Sheet for Patients:  PinkCheek.be Fact Sheet for Healthcare Providers:  GravelBags.it This test is not yet ap proved or cleared by the [place] FDA and  has been authorized for detection and/or diagnosis of SARS-CoV-2 by FDA under an Emergency Use Authorization (EUA). This EUA will remain  in effect (meaning this test can be used) for the duration of the COVID-19 declaration under Section 564(b)(1) of the Act, 21 U.S.C. section 360bbb-3(b)(1), unless the authorization is terminated or revoked sooner.    Influenza Dakoda Bassette by PCR NEGATIVE NEGATIVE Final   Influenza B by PCR NEGATIVE NEGATIVE Final    Comment: (NOTE) The Xpert Xpress SARS-CoV-2/FLU/RSV assay is intended as an aid in  the diagnosis of influenza from Nasopharyngeal swab specimens and  should not be used as Codee Tutson sole  basis for treatment. Nasal washings and  aspirates are unacceptable for Xpert Xpress SARS-CoV-2/FLU/RSV  testing. Fact Sheet for Patients: PinkCheek.be Fact Sheet for Healthcare Providers: GravelBags.it This test is not yet approved or cleared by the [place] FDA and  has been authorized for detection and/or diagnosis of SARS-CoV-2 by  FDA under an Emergency Use Authorization (EUA). This EUA will remain  in effect (meaning this test can be used) for the duration of the  Covid-19 declaration under Section 564(b)(1) of the Act, 21  U.S.C. section 360bbb-3(b)(1), unless the authorization is  terminated or revoked. Performed at [facility], [place] [address], [place], [place] [address]   Aerobic/Anaerobic Culture (surgical/deep wound)     Status: None   Collection Time: 07/27/19 10:55 AM   Specimen: Abdomen; Abscess  Result Value Ref Range Status   Specimen Description ABDOMEN SYRINGE  Final   Special Requests RIGHT LOWER QUADRANT  Final   Gram Stain   Final    ABUNDANT WBC PRESENT, PREDOMINANTLY PMN ABUNDANT GRAM NEGATIVE RODS ABUNDANT GRAM POSITIVE COCCI ABUNDANT GRAM POSITIVE RODS    Culture   Final    FEW KLEBSIELLA PNEUMONIAE ABUNDANT STREPTOCOCCUS CONSTELLATUS ABUNDANT BACTEROIDES CACCAE BETA LACTAMASE POSITIVE Performed at [facility], [place] [address], [place], [place] [address]    Report Status 07/30/2019 FINAL  Final   Organism ID, Bacteria KLEBSIELLA PNEUMONIAE  Final   Organism ID, Bacteria STREPTOCOCCUS CONSTELLATUS  Final      Susceptibility   Streptococcus constellatus - MIC*    PENICILLIN 0.25 INTERMEDIATE Intermediate     CEFTRIAXONE 1 SENSITIVE Sensitive     ERYTHROMYCIN <=0.12 SENSITIVE Sensitive     LEVOFLOXACIN <=0.25 SENSITIVE Sensitive     VANCOMYCIN 0.5 SENSITIVE Sensitive     * ABUNDANT STREPTOCOCCUS CONSTELLATUS   Klebsiella pneumoniae - MIC*    AMPICILLIN RESISTANT Resistant       CEFAZOLIN <=4 SENSITIVE Sensitive     CEFEPIME <=0.12 SENSITIVE Sensitive     CEFTAZIDIME <=1 SENSITIVE Sensitive     CEFTRIAXONE <=0.25 SENSITIVE Sensitive     CIPROFLOXACIN <=0.25 SENSITIVE Sensitive     GENTAMICIN <=1 SENSITIVE Sensitive     IMIPENEM <=0.25 SENSITIVE Sensitive     TRIMETH/SULFA <=20 SENSITIVE Sensitive     AMPICILLIN/SULBACTAM 4 SENSITIVE Sensitive     PIP/TAZO <=4 SENSITIVE Sensitive     * FEW KLEBSIELLA PNEUMONIAE         Radiology Studies: DG Abd Portable 1V  Result Date: 07/30/2019 CLINICAL DATA:  Perforated appendicitis with abscess.  Abdominal pain. Ileus. EXAM: PORTABLE ABDOMEN - 1 VIEW COMPARISON:  07/28/2019.  CT 07/28/2019. FINDINGS: Interim removal of NG tube. Pigtail drainage catheter noted over the right lower quadrant. Persistent unchanged dilated loops of small bowel again noted. Colonic gas pattern is normal. No free air. Pelvic calcifications consistent phleboliths. No acute bony abnormality. IMPRESSION: 1. Interim removal of NG tube. Pigtail drainage catheter noted over the right lower quadrant. 2. Persistent unchanged dilated loops of small bowel noted. Colonic gas pattern is normal. Adynamic ileus could present this fashion. Small-bowel obstruction cannot be excluded. Continued follow-up exam suggested. Electronically Signed   By: Marcello Moores  Register   On: 07/30/2019 06:59        Scheduled Meds: . acetaminophen  1,000 mg Oral Q6H  . [START ON 08/01/2019] amLODipine  10 mg Oral Daily  . amLODipine  5 mg Oral Once  . enoxaparin (LOVENOX) injection  40 mg Subcutaneous Daily  . methocarbamol  500 mg Oral TID  . metoprolol tartrate  25 mg Oral BID  . polyethylene glycol  17 g Oral Daily  . sodium chloride flush  5 mL Intracatheter Q8H   Continuous Infusions: . dextrose 5 % and 0.45% NaCl 50 mL/hr at 07/30/19 2100  . piperacillin-tazobactam (ZOSYN)  IV 3.375 g (07/31/19 1110)     LOS: 4 days    Time spent: over 30 min    Fayrene Helper, MD [facility]   To contact the attending provider between 7A-7P or the covering provider during after hours 7P-7A, please log into the web site [email or web address] and access using universal [place] password for that web site. If you do not have the password, please call the hospital operator.  07/31/2019, 1:25 PM

## 2019-08-01 LAB — CBC
HCT: 37.5 % — ABNORMAL LOW (ref 39.0–52.0)
Hemoglobin: 12.6 g/dL — ABNORMAL LOW (ref 13.0–17.0)
MCH: 30.7 pg (ref 26.0–34.0)
MCHC: 33.6 g/dL (ref 30.0–36.0)
MCV: 91.5 fL (ref 80.0–100.0)
Platelets: 475 10*3/uL — ABNORMAL HIGH (ref 150–400)
RBC: 4.1 MIL/uL — ABNORMAL LOW (ref 4.22–5.81)
RDW: 13 % (ref 11.5–15.5)
WBC: 15.6 10*3/uL — ABNORMAL HIGH (ref 4.0–10.5)
nRBC: 0 % (ref 0.0–0.2)

## 2019-08-01 LAB — COMPREHENSIVE METABOLIC PANEL
ALT: 32 U/L (ref 0–44)
AST: 26 U/L (ref 15–41)
Albumin: 2.5 g/dL — ABNORMAL LOW (ref 3.5–5.0)
Alkaline Phosphatase: 66 U/L (ref 38–126)
Anion gap: 11 (ref 5–15)
BUN: 15 mg/dL (ref 6–20)
CO2: 24 mmol/L (ref 22–32)
Calcium: 8.8 mg/dL — ABNORMAL LOW (ref 8.9–10.3)
Chloride: 100 mmol/L (ref 98–111)
Creatinine, Ser: 0.94 mg/dL (ref 0.61–1.24)
GFR calc Af Amer: >60 mL/min (ref >60)
GFR calc non Af Amer: >60 mL/min (ref >60)
Glucose, Bld: 118 mg/dL — ABNORMAL HIGH (ref 70–99)
Potassium: 3.8 mmol/L (ref 3.5–5.1)
Sodium: 135 mmol/L (ref 135–145)
Total Bilirubin: 0.8 mg/dL (ref 0.3–1.2)
Total Protein: 6.1 g/dL — ABNORMAL LOW (ref 6.5–8.1)

## 2019-08-01 LAB — PHOSPHORUS: Phosphorus: 2.6 mg/dL (ref 2.5–4.6)

## 2019-08-01 LAB — MAGNESIUM: Magnesium: 1.5 mg/dL — ABNORMAL LOW (ref 1.7–2.4)

## 2019-08-01 MED ORDER — MAGNESIUM SULFATE 2 GM/50ML IV SOLN
2.0000 g | Freq: Once | INTRAVENOUS | Status: AC
  Administered 2019-08-01: 2 g via INTRAVENOUS
  Filled 2019-08-01: qty 50

## 2019-08-01 MED ORDER — HYDROCHLOROTHIAZIDE 12.5 MG PO CAPS
12.5000 mg | ORAL_CAPSULE | Freq: Every day | ORAL | Status: DC
  Administered 2019-08-01 – 2019-08-05 (×5): 12.5 mg via ORAL
  Filled 2019-08-01 (×5): qty 1

## 2019-08-01 NOTE — Progress Notes (Signed)
Referring Physician(s): Dr Milford Cage  Supervising Physician: Irish Lack  Patient Status:  [facility] - In-pt  Chief Complaint:  RLQ drain placed 07/27/19   Subjective:  Pt feeling some better OP is milky yellow from drain Soreness at site Wbc up today Recheck in am per notes   Allergies: Patient has no known allergies.  Medications: Prior to Admission medications   Not on File     Vital Signs: BP (!) 165/110   Pulse 90   Temp 97.8 F (36.6 C) (Oral)   Resp 18   Ht 6' 1.5" (1.867 m)   Wt 152 lb (68.9 kg)   SpO2 99%   BMI 19.78 kg/m   Physical Exam Vitals reviewed.  Skin:    General: Skin is warm and dry.     Comments: Site of drain is clean and dry NT no bleeding No sign of infection OP milky yellow 10 cc in bag 30 cc yesterday  Report Status 07/30/2019 FINAL  Organism ID, Bacteria KLEBSIELLA PNEUMONIAE  Organism ID, Bacteria STREPTOCOCCUS CONSTELLATUS        Imaging: DG Abd Portable 1V  Result Date: 07/30/2019 CLINICAL DATA:  Perforated appendicitis with abscess. Abdominal pain. Ileus. EXAM: PORTABLE ABDOMEN - 1 VIEW COMPARISON:  07/28/2019.  CT 07/28/2019. FINDINGS: Interim removal of NG tube. Pigtail drainage catheter noted over the right lower quadrant. Persistent unchanged dilated loops of small bowel again noted. Colonic gas pattern is normal. No free air. Pelvic calcifications consistent phleboliths. No acute bony abnormality. IMPRESSION: 1. Interim removal of NG tube. Pigtail drainage catheter noted over the right lower quadrant. 2. Persistent unchanged dilated loops of small bowel noted. Colonic gas pattern is normal. Adynamic ileus could present this fashion. Small-bowel obstruction cannot be excluded. Continued follow-up exam suggested. Electronically Signed   By: Maisie Fus  Register   On: 07/30/2019 06:59    Labs:  CBC: Recent Labs    07/28/19 0606 07/29/19 0504 07/31/19 0024 08/01/19 0447  WBC 19.5* 10.0 8.7 15.6*  HGB 13.3 12.8*  12.3* 12.6*  HCT 41.5 39.9 37.9* 37.5*  PLT 400 396 398 475*    COAGS: Recent Labs    07/27/19 0506  INR 1.3*    BMP: Recent Labs    07/29/19 0504 07/30/19 0330 07/31/19 0024 08/01/19 0447  NA 143 141 138 135  K 5.1 4.4 4.6 3.8  CL 110 106 103 100  CO2 19* 22 25 24   GLUCOSE 114* 131* 143* 118*  BUN 39* 26* 20 15  CALCIUM 8.6* 8.6* 8.7* 8.8*  CREATININE 1.31* 1.10 0.98 0.94  GFRNONAA >60 >60 >60 >60  GFRAA >60 >60 >60 >60    LIVER FUNCTION TESTS: Recent Labs    07/26/19 1720 08/01/19 0447  BILITOT 0.8 0.8  AST 14* 26  ALT 9 32  ALKPHOS 75 66  PROT 8.3* 6.1*  ALBUMIN 3.5 2.5*    Assessment and Plan:  RLQ abscess drain placed 3/23 OP milky yellow KLEBSIELLA PNEUMONIAE    Organism ID, Bacteria STREPTOCOCCUS CONSTELLATUS    Wbc up to 15 today-- recheck in am Will need to flush drain daily at home-- please give Rx for flushes Record OP daily He will hear from IR OP Clinic for follow up time and date He is aware   Electronically Signed: 4/23, PA-C 08/01/2019, 12:15 PM   I spent a total of 15 Minutes at the the patient's bedside AND on the patient's hospital floor or unit, greater than 50% of which was counseling/coordinating  care for RLQ drain

## 2019-08-01 NOTE — Progress Notes (Signed)
   Subjective/Chief Complaint: Feels like his pain is improving, still there.   Objective: Vital signs in last 24 hours: Temp:  [97.8 F (36.6 C)-98.2 F (36.8 C)] 97.8 F (36.6 C) (03/28 0843) Pulse Rate:  [84-95] 91 (03/28 0843) Resp:  [18] 18 (03/28 0843) BP: (148-184)/(98-111) 175/111 (03/28 0843) SpO2:  [98 %-100 %] 99 % (03/28 0843) Last BM Date: 07/31/19  Intake/Output from previous day: 03/27 0701 - 03/28 0700 In: 1250 [P.O.:720; I.V.:441.3; IV Piggyback:78.7] Out: 830 [Urine:800; Drains:30] Intake/Output this shift: Total I/O In: 240 [P.O.:240] Out: -   General appearance: cooperative Resp: clear to auscultation bilaterally Cardio: regular rate and rhythm GI: soft, min tenderness RLQ, drain with purulent output  Lab Results:  Recent Labs    07/31/19 0024 08/01/19 0447  WBC 8.7 15.6*  HGB 12.3* 12.6*  HCT 37.9* 37.5*  PLT 398 475*   BMET Recent Labs    07/31/19 0024 08/01/19 0447  NA 138 135  K 4.6 3.8  CL 103 100  CO2 25 24  GLUCOSE 143* 118*  BUN 20 15  CREATININE 0.98 0.94  CALCIUM 8.7* 8.8*   PT/INR No results for input(s): LABPROT, INR in the last 72 hours. ABG No results for input(s): PHART, HCO3 in the last 72 hours.  Invalid input(s): PCO2, PO2  Studies/Results: No results found.  Anti-infectives: Anti-infectives (From admission, onward)   Start     Dose/Rate Route Frequency Ordered Stop   07/27/19 1200  piperacillin-tazobactam (ZOSYN) IVPB 3.375 g     3.375 g 12.5 mL/hr over 240 Minutes Intravenous Every 8 hours 07/27/19 0403     07/27/19 0315  piperacillin-tazobactam (ZOSYN) IVPB 3.375 g     3.375 g 100 mL/hr over 30 Minutes Intravenous  Once 07/27/19 0305 07/27/19 0401      Assessment/Plan: Perforated appendicitis with abscess -ct 3/24 with ileus after drain placement, now clinically resolved, ng out, tol diet -WBC up today to 15.6 from 8 -Zosyn - Dr. Dwain Sarna discussed possible interval appy and will see back in  office once discharged Acute kidney injury -resolved HTN -norvasc and metoprolol per TRH VTE -Lovenox, scds  Increased WBC but clinically doing well. Would continue IV abx and monitor today. If WBC up again tomorrow, repeat scan then (That will be 5 days from last scan).    LOS: 5 days    Berna Bue 08/01/2019

## 2019-08-01 NOTE — Progress Notes (Signed)
PROGRESS NOTE    Russell Wells  WTU:[id number] DOB: [birth year] DOA: 07/26/2019 PCP: Patient, No Pcp Per   Brief Narrative:  Russell Wells is an 49 y.o. male without known PMH who presented on 3/22 with perforated appendicitis.  He reports that he works overnight at [employer] and he developed sharp pain in his abdomen.  He drank [name] cold drink but the sharp pain in his RLQ was still ongoing.  He finished working, went home, and took Advil and tried to rest.  He slept all day and it was [name] little better than night when he got up.  About 3AM "all hell broke loose."  The pain was out of control.  He finally came to the ER on 3/23 and they put Cathleen Yagi drain in.  Since the drain was placed, intermittent pain that is well controlled with the medication.  He was NPO for 2 days, yesterday he ate something with clear liquids and plan for advancing diet today.  He had physicals every year and was told to watch his BP.  He has never been on medication.  No h/o chronic medical problems.  Assessment & Plan:   Principal Problem:   Perforated appendicitis Active Problems:   Essential hypertension  Perforated appendicitis -Patient admitted by surgery with placement of pigtail drainage catheter in RLQ -Developed [name] post-operative ileus; NG tube now out and currently on regular diet - cx with klebsiella pneumoniae (resistant to ampicillin) and strep constellatus (intermediate to penicillin) -Continues on Zosyn -Pain management with PO medications now -Advancing diet -Patient should be encouraged to get OOB and ambulate - WBC up today, surgery planning to continue IV abx - if WBC up again, planning to repeat scan -He may be able to d/c soon with plan for outpatient surgery f/u and likely subsequent appendectomy  AKI -Prerenal azotemia in the setting of intraabdominal infection -Resolved  HTN -No prior formal diagnosis but he has been told to monitor his BP for years due to elevated BPs and +FH -Pain  currently seems well controlled -Increase norvasc to 10 mg, continue metoprolol 25 mg BID.  Add HCTZ 12.5 mg today and continue to monitor. -Continue prn Hydralazine -Will need PCP for outpatient f/u - will request TOC team consult  DVT prophylaxis: lovenox Code Status: full Family Communication: none at bedside Disposition Plan:  . Patient came from: home            . Anticipated d/c place:home . Barriers to d/c OR conditions which need to be met to effect [name] safe d/c: discharge by surgery, primary team   Consultants:   Surgery is primary  Procedures:  RLQ abscess drain catheter placement on 3/23 by IR  Antimicrobials:  Anti-infectives (From admission, onward)   Start     Dose/Rate Route Frequency Ordered Stop   07/27/19 1200  piperacillin-tazobactam (ZOSYN) IVPB 3.375 g     3.375 g 12.5 mL/hr over 240 Minutes Intravenous Every 8 hours 07/27/19 0403     07/27/19 0315  piperacillin-tazobactam (ZOSYN) IVPB 3.375 g     3.375 g 100 mL/hr over 30 Minutes Intravenous  Once 07/27/19 0305 07/27/19 0401     Subjective: Pain well controlled No new complaints  Objective: Vitals:   08/01/19 0509 08/01/19 0843 08/01/19 1059 08/01/19 1418  BP: (!) 161/104 (!) 175/111 (!) 165/110 (!) 155/106  Pulse: 92 91 90 86  Resp:  18  18  Temp:  97.8 F (36.6 C)  97.8 F (36.6 C)  TempSrc:  Oral  Oral  SpO2:  99% 99% 99%  Weight:      Height:        Intake/Output Summary (Last 24 hours) at 08/01/2019 1449 Last data filed at 08/01/2019 1300 Gross per 24 hour  Intake 1484.97 ml  Output 830 ml  Net 654.97 ml   Filed Weights   07/26/19 1709  Weight: 68.9 kg    Examination:  General: No acute distress. Cardiovascular: Heart sounds show [name] regular rate, and rhythm.  Lungs: Clear to auscultation bilaterally Abdomen: Soft, nontender, nondistended.  Drain in place. Neurological: Alert and oriented 3. Moves all extremities 4 . Cranial nerves II through XII grossly intact. Skin: Warm  and dry. No rashes or lesions. Extremities: No clubbing or cyanosis. No edema.   Data Reviewed: I have personally reviewed following labs and imaging studies  CBC: Recent Labs  Lab 07/26/19 1720 07/28/19 0606 07/29/19 0504 07/31/19 0024 08/01/19 0447  WBC 25.8* 19.5* 10.0 8.7 15.6*  HGB 15.0 13.3 12.8* 12.3* 12.6*  HCT 45.5 41.5 39.9 37.9* 37.5*  MCV 95.0 95.2 95.2 93.6 91.5  PLT 352 400 396 398 932*   Basic Metabolic Panel: Recent Labs  Lab 07/28/19 0606 07/29/19 0504 07/30/19 0330 07/31/19 0024 08/01/19 0447  NA 140 143 141 138 135  K 4.9 5.1 4.4 4.6 3.8  CL 105 110 106 103 100  CO2 23 19* '22 25 24  ' GLUCOSE 127* 114* 131* 143* 118*  BUN 46* 39* 26* 20 15  CREATININE 1.77* 1.31* 1.10 0.98 0.94  CALCIUM 8.7* 8.6* 8.6* 8.7* 8.8*  MG  --   --   --   --  1.5*  PHOS  --   --   --   --  2.6   GFR: Estimated Creatinine Clearance: 92.6 mL/min (by C-G formula based on SCr of 0.94 mg/dL). Liver Function Tests: Recent Labs  Lab 07/26/19 1720 08/01/19 0447  AST 14* 26  ALT 9 32  ALKPHOS 75 66  BILITOT 0.8 0.8  PROT 8.3* 6.1*  ALBUMIN 3.5 2.5*   Recent Labs  Lab 07/26/19 1720  LIPASE 21   No results for input(s): AMMONIA in the last 168 hours. Coagulation Profile: Recent Labs  Lab 07/27/19 0506  INR 1.3*   Cardiac Enzymes: No results for input(s): CKTOTAL, CKMB, CKMBINDEX, TROPONINI in the last 168 hours. BNP (last 3 results) No results for input(s): PROBNP in the last 8760 hours. HbA1C: No results for input(s): HGBA1C in the last 72 hours. CBG: No results for input(s): GLUCAP in the last 168 hours. Lipid Profile: No results for input(s): CHOL, HDL, LDLCALC, TRIG, CHOLHDL, LDLDIRECT in the last 72 hours. Thyroid Function Tests: No results for input(s): TSH, T4TOTAL, FREET4, T3FREE, THYROIDAB in the last 72 hours. Anemia Panel: No results for input(s): VITAMINB12, FOLATE, FERRITIN, TIBC, IRON, RETICCTPCT in the last 72 hours. Sepsis Labs: No results  for input(s): PROCALCITON, LATICACIDVEN in the last 168 hours.  Recent Results (from the past 240 hour(s))  Respiratory Panel by RT PCR (Flu Grissel Tyrell&B, Covid) - Nasopharyngeal Swab     Status: None   Collection Time: 07/27/19  3:06 AM   Specimen: Nasopharyngeal Swab  Result Value Ref Range Status   SARS Coronavirus 2 by RT PCR NEGATIVE NEGATIVE Final    Comment: (NOTE) SARS-CoV-2 target nucleic acids are NOT DETECTED. The SARS-CoV-2 RNA is generally detectable in upper respiratoy specimens during the acute phase of infection. The lowest concentration of SARS-CoV-2 viral copies this assay can detect is 131 copies/mL.  Soua Caltagirone negative result does not preclude SARS-Cov-2 infection and should not be used as the sole basis for treatment or other patient management decisions. Devario Bucklew negative result may occur with  improper specimen collection/handling, submission of specimen other than nasopharyngeal swab, presence of viral mutation(s) within the areas targeted by this assay, and inadequate number of viral copies (<131 copies/mL). Cesiah Westley negative result must be combined with clinical observations, patient history, and epidemiological information. The expected result is Negative. Fact Sheet for Patients:  PinkCheek.be Fact Sheet for Healthcare Providers:  GravelBags.it This test is not yet ap proved or cleared by the [place] FDA and  has been authorized for detection and/or diagnosis of SARS-CoV-2 by FDA under an Emergency Use Authorization (EUA). This EUA will remain  in effect (meaning this test can be used) for the duration of the COVID-19 declaration under Section 564(b)(1) of the Act, 21 U.S.C. section 360bbb-3(b)(1), unless the authorization is terminated or revoked sooner.    Influenza Columbia Pandey by PCR NEGATIVE NEGATIVE Final   Influenza B by PCR NEGATIVE NEGATIVE Final    Comment: (NOTE) The Xpert Xpress SARS-CoV-2/FLU/RSV assay is intended as an  aid in  the diagnosis of influenza from Nasopharyngeal swab specimens and  should not be used as Linell Shawn sole basis for treatment. Nasal washings and  aspirates are unacceptable for Xpert Xpress SARS-CoV-2/FLU/RSV  testing. Fact Sheet for Patients: PinkCheek.be Fact Sheet for Healthcare Providers: GravelBags.it This test is not yet approved or cleared by the [place] FDA and  has been authorized for detection and/or diagnosis of SARS-CoV-2 by  FDA under an Emergency Use Authorization (EUA). This EUA will remain  in effect (meaning this test can be used) for the duration of the  Covid-19 declaration under Section 564(b)(1) of the Act, 21  U.S.C. section 360bbb-3(b)(1), unless the authorization is  terminated or revoked. Performed at [facility], [place] [address] [place], [place] [address]   Aerobic/Anaerobic Culture (surgical/deep wound)     Status: None   Collection Time: 07/27/19 10:55 AM   Specimen: Abdomen; Abscess  Result Value Ref Range Status   Specimen Description ABDOMEN SYRINGE  Final   Special Requests RIGHT LOWER QUADRANT  Final   Gram Stain   Final    ABUNDANT WBC PRESENT, PREDOMINANTLY PMN ABUNDANT GRAM NEGATIVE RODS ABUNDANT GRAM POSITIVE COCCI ABUNDANT GRAM POSITIVE RODS    Culture   Final    FEW KLEBSIELLA PNEUMONIAE ABUNDANT STREPTOCOCCUS CONSTELLATUS ABUNDANT BACTEROIDES CACCAE BETA LACTAMASE POSITIVE Performed at [facility], [place] [address] [place], [place] [address]    Report Status 07/30/2019 FINAL  Final   Organism ID, Bacteria KLEBSIELLA PNEUMONIAE  Final   Organism ID, Bacteria STREPTOCOCCUS CONSTELLATUS  Final      Susceptibility   Streptococcus constellatus - MIC*    PENICILLIN 0.25 INTERMEDIATE Intermediate     CEFTRIAXONE 1 SENSITIVE Sensitive     ERYTHROMYCIN <=0.12 SENSITIVE Sensitive     LEVOFLOXACIN <=0.25 SENSITIVE Sensitive     VANCOMYCIN 0.5 SENSITIVE Sensitive      * ABUNDANT STREPTOCOCCUS CONSTELLATUS   Klebsiella pneumoniae - MIC*    AMPICILLIN RESISTANT Resistant     CEFAZOLIN <=4 SENSITIVE Sensitive     CEFEPIME <=0.12 SENSITIVE Sensitive     CEFTAZIDIME <=1 SENSITIVE Sensitive     CEFTRIAXONE <=0.25 SENSITIVE Sensitive     CIPROFLOXACIN <=0.25 SENSITIVE Sensitive     GENTAMICIN <=1 SENSITIVE Sensitive     IMIPENEM <=0.25 SENSITIVE Sensitive     TRIMETH/SULFA <=20  SENSITIVE Sensitive     AMPICILLIN/SULBACTAM 4 SENSITIVE Sensitive     PIP/TAZO <=4 SENSITIVE Sensitive     * FEW KLEBSIELLA PNEUMONIAE         Radiology Studies: No results found.      Scheduled Meds: . acetaminophen  1,000 mg Oral Q6H  . amLODipine  10 mg Oral Daily  . enoxaparin (LOVENOX) injection  40 mg Subcutaneous Daily  . hydrochlorothiazide  12.5 mg Oral Daily  . methocarbamol  500 mg Oral TID  . metoprolol tartrate  25 mg Oral BID  . polyethylene glycol  17 g Oral Daily  . sodium chloride flush  5 mL Intracatheter Q8H   Continuous Infusions: . dextrose 5 % and 0.45% NaCl 50 mL/hr at 08/01/19 1043  . piperacillin-tazobactam (ZOSYN)  IV 3.375 g (08/01/19 1121)     LOS: 5 days    Time spent: over 90 min    Fayrene Helper, MD [facility]   To contact the attending provider between 7A-7P or the covering provider during after hours 7P-7A, please log into the web site [email or web address] and access using universal [place] password for that web site. If you do not have the password, please call the hospital operator.  08/01/2019, 2:49 PM

## 2019-08-01 NOTE — Plan of Care (Signed)

## 2019-08-02 ENCOUNTER — Inpatient Hospital Stay (HOSPITAL_COMMUNITY): Payer: No Typology Code available for payment source

## 2019-08-02 LAB — CBC WITH DIFFERENTIAL/PLATELET
Abs Immature Granulocytes: 0.62 10*3/uL — ABNORMAL HIGH (ref 0.00–0.07)
Basophils Absolute: 0.1 10*3/uL (ref 0.0–0.1)
Basophils Relative: 0 %
Eosinophils Absolute: 0.4 10*3/uL (ref 0.0–0.5)
Eosinophils Relative: 2 %
HCT: 36.1 % — ABNORMAL LOW (ref 39.0–52.0)
Hemoglobin: 12.1 g/dL — ABNORMAL LOW (ref 13.0–17.0)
Immature Granulocytes: 3 %
Lymphocytes Relative: 16 %
Lymphs Abs: 3.3 10*3/uL (ref 0.7–4.0)
MCH: 30.6 pg (ref 26.0–34.0)
MCHC: 33.5 g/dL (ref 30.0–36.0)
MCV: 91.2 fL (ref 80.0–100.0)
Monocytes Absolute: 1.9 10*3/uL — ABNORMAL HIGH (ref 0.1–1.0)
Monocytes Relative: 9 %
Neutro Abs: 14.4 10*3/uL — ABNORMAL HIGH (ref 1.7–7.7)
Neutrophils Relative %: 70 %
Platelets: 548 10*3/uL — ABNORMAL HIGH (ref 150–400)
RBC: 3.96 MIL/uL — ABNORMAL LOW (ref 4.22–5.81)
RDW: 12.6 % (ref 11.5–15.5)
WBC: 20.7 10*3/uL — ABNORMAL HIGH (ref 4.0–10.5)
nRBC: 0 % (ref 0.0–0.2)

## 2019-08-02 LAB — COMPREHENSIVE METABOLIC PANEL
ALT: 45 U/L — ABNORMAL HIGH (ref 0–44)
AST: 30 U/L (ref 15–41)
Albumin: 2.4 g/dL — ABNORMAL LOW (ref 3.5–5.0)
Alkaline Phosphatase: 65 U/L (ref 38–126)
Anion gap: 11 (ref 5–15)
BUN: 11 mg/dL (ref 6–20)
CO2: 28 mmol/L (ref 22–32)
Calcium: 8.4 mg/dL — ABNORMAL LOW (ref 8.9–10.3)
Chloride: 95 mmol/L — ABNORMAL LOW (ref 98–111)
Creatinine, Ser: 0.93 mg/dL (ref 0.61–1.24)
GFR calc Af Amer: >60 mL/min (ref >60)
GFR calc non Af Amer: >60 mL/min (ref >60)
Glucose, Bld: 118 mg/dL — ABNORMAL HIGH (ref 70–99)
Potassium: 4 mmol/L (ref 3.5–5.1)
Sodium: 134 mmol/L — ABNORMAL LOW (ref 135–145)
Total Bilirubin: 0.6 mg/dL (ref 0.3–1.2)
Total Protein: 5.5 g/dL — ABNORMAL LOW (ref 6.5–8.1)

## 2019-08-02 LAB — PHOSPHORUS: Phosphorus: 3.2 mg/dL (ref 2.5–4.6)

## 2019-08-02 LAB — MAGNESIUM: Magnesium: 1.6 mg/dL — ABNORMAL LOW (ref 1.7–2.4)

## 2019-08-02 MED ORDER — IOHEXOL 300 MG/ML  SOLN
100.0000 mL | Freq: Once | INTRAMUSCULAR | Status: AC | PRN
  Administered 2019-08-02: 100 mL via INTRAVENOUS

## 2019-08-02 MED ORDER — MAGNESIUM SULFATE 4 GM/100ML IV SOLN
4.0000 g | Freq: Once | INTRAVENOUS | Status: AC
  Administered 2019-08-02: 4 g via INTRAVENOUS
  Filled 2019-08-02: qty 100

## 2019-08-02 MED ORDER — FENTANYL CITRATE (PF) 100 MCG/2ML IJ SOLN
25.0000 ug | Freq: Once | INTRAMUSCULAR | Status: AC
  Administered 2019-08-02: 23:00:00 25 ug via INTRAVENOUS
  Filled 2019-08-02: qty 2

## 2019-08-02 MED ORDER — METOPROLOL TARTRATE 5 MG/5ML IV SOLN: 5.0000 mg | Freq: Four times a day (QID) | INTRAVENOUS | Status: DC | PRN

## 2019-08-02 NOTE — Plan of Care (Signed)

## 2019-08-02 NOTE — Progress Notes (Signed)
PROGRESS NOTE    Russell Wells  BDZ:[id number] DOB: 26-Jan-1971 DOA: 07/26/2019 PCP: Patient, No Pcp Per   Brief Narrative:  Russell Wells is an 49 y.o. male without known PMH who presented on 3/22 with perforated appendicitis.  He reports that he works overnight at [employer] and he developed sharp pain in his abdomen.  He drank [place] cold drink but the sharp pain in his RLQ was still ongoing.  He finished working, went home, and took Advil and tried to rest.  He slept all day and it was Erum Cercone little better than night when he got up.  About 3AM "all hell broke loose."  The pain was out of control.  He finally came to the ER on 3/23 and they put Keyatta Tolles drain in.  Since the drain was placed, intermittent pain that is well controlled with the medication.  He was NPO for 2 days, yesterday he ate something with clear liquids and plan for advancing diet today.  He had physicals every year and was told to watch his BP.  He has never been on medication.  No h/o chronic medical problems.  Assessment & Plan:   Principal Problem:   Perforated appendicitis Active Problems:   Essential hypertension  Perforated appendicitis -Patient admitted by surgery with placement of pigtail drainage catheter in RLQ -Developed Lyriq Finerty post-operative ileus; NG tube now out and currently on regular diet - cx with klebsiella pneumoniae (resistant to ampicillin) and strep constellatus (intermediate to penicillin) -Continues on Zosyn -Pain management with PO medications now -Advancing diet -Patient should be encouraged to get OOB and ambulate - WBC up today, surgery planning to continue IV abx - repeat CT scan per surgery -He may be able to d/c soon with plan for outpatient surgery f/u and likely subsequent appendectomy  AKI -Prerenal azotemia in the setting of intraabdominal infection -Resolved  HTN -No prior formal diagnosis but he has been told to monitor his BP for years due to elevated BPs and +FH -Pain currently  seems well controlled -> BP remains above goal -Increase norvasc to 10 mg, continue metoprolol 25 mg BID and HCTZ 12.5.  Adjust as needed. -Continue prn Hydralazine -Will need PCP for outpatient f/u - will request TOC team consult  Hypomagnesemia: replace and follow  DVT prophylaxis: lovenox Code Status: full Family Communication: none at bedside Disposition Plan:  . Patient came from: home            . Anticipated d/c place:home . Barriers to d/c OR conditions which need to be met to effect Treyvonne Tata safe d/c: discharge by surgery, primary team   Consultants:   Surgery is primary  Procedures:  RLQ abscess drain catheter placement on 3/23 by IR  Antimicrobials:  Anti-infectives (From admission, onward)   Start     Dose/Rate Route Frequency Ordered Stop   07/27/19 1200  piperacillin-tazobactam (ZOSYN) IVPB 3.375 g     3.375 g 12.5 mL/hr over 240 Minutes Intravenous Every 8 hours 07/27/19 0403     07/27/19 0315  piperacillin-tazobactam (ZOSYN) IVPB 3.375 g     3.375 g 100 mL/hr over 30 Minutes Intravenous  Once 07/27/19 0305 07/27/19 0401     Subjective: No new complaints  Objective: Vitals:   08/01/19 1418 08/01/19 1900 08/02/19 0304 08/02/19 0845  BP: (!) 155/106 (!) 170/106 (!) 150/101 (!) 164/111  Pulse: 86 92 82 87  Resp: '18 18  18  ' Temp: 97.8 F (36.6 C) 98.4 F (36.9 C) 98.3 F (36.8 C) 98.8 F (  37.1 C)  TempSrc: Oral Oral Oral Oral  SpO2: 99% 97% 99% 98%  Weight:      Height:        Intake/Output Summary (Last 24 hours) at 08/02/2019 1543 Last data filed at 08/02/2019 1500 Gross per 24 hour  Intake 480 ml  Output 400 ml  Net 80 ml   Filed Weights   07/26/19 1709  Weight: 68.9 kg    Examination:  General: No acute distress. Cardiovascular: Heart sounds show Adams Hinch regular rate, and rhythm Lungs: Clear to auscultation bilaterally  Abdomen: Soft, nontender, nondistended. Drain in place. Neurological: Alert and oriented 3. Moves all extremities 4 . Cranial  nerves II through XII grossly intact. Skin: Warm and dry. No rashes or lesions. Extremities: No clubbing or cyanosis. No edema.   Data Reviewed: I have personally reviewed following labs and imaging studies  CBC: Recent Labs  Lab 07/28/19 0606 07/29/19 0504 07/31/19 0024 08/01/19 0447 08/02/19 0537  WBC 19.5* 10.0 8.7 15.6* 20.7*  NEUTROABS  --   --   --   --  14.4*  HGB 13.3 12.8* 12.3* 12.6* 12.1*  HCT 41.5 39.9 37.9* 37.5* 36.1*  MCV 95.2 95.2 93.6 91.5 91.2  PLT 400 396 398 475* 150*   Basic Metabolic Panel: Recent Labs  Lab 07/29/19 0504 07/30/19 0330 07/31/19 0024 08/01/19 0447 08/02/19 0537  NA 143 141 138 135 134*  K 5.1 4.4 4.6 3.8 4.0  CL 110 106 103 100 95*  CO2 19* '22 25 24 28  ' GLUCOSE 114* 131* 143* 118* 118*  BUN 39* 26* '20 15 11  ' CREATININE 1.31* 1.10 0.98 0.94 0.93  CALCIUM 8.6* 8.6* 8.7* 8.8* 8.4*  MG  --   --   --  1.5* 1.6*  PHOS  --   --   --  2.6 3.2   GFR: Estimated Creatinine Clearance: 93.6 mL/min (by C-G formula based on SCr of 0.93 mg/dL). Liver Function Tests: Recent Labs  Lab 07/26/19 1720 08/01/19 0447 08/02/19 0537  AST 14* 26 30  ALT 9 32 45*  ALKPHOS 75 66 65  BILITOT 0.8 0.8 0.6  PROT 8.3* 6.1* 5.5*  ALBUMIN 3.5 2.5* 2.4*   Recent Labs  Lab 07/26/19 1720  LIPASE 21   No results for input(s): AMMONIA in the last 168 hours. Coagulation Profile: Recent Labs  Lab 07/27/19 0506  INR 1.3*   Cardiac Enzymes: No results for input(s): CKTOTAL, CKMB, CKMBINDEX, TROPONINI in the last 168 hours. BNP (last 3 results) No results for input(s): PROBNP in the last 8760 hours. HbA1C: No results for input(s): HGBA1C in the last 72 hours. CBG: No results for input(s): GLUCAP in the last 168 hours. Lipid Profile: No results for input(s): CHOL, HDL, LDLCALC, TRIG, CHOLHDL, LDLDIRECT in the last 72 hours. Thyroid Function Tests: No results for input(s): TSH, T4TOTAL, FREET4, T3FREE, THYROIDAB in the last 72 hours. Anemia  Panel: No results for input(s): VITAMINB12, FOLATE, FERRITIN, TIBC, IRON, RETICCTPCT in the last 72 hours. Sepsis Labs: No results for input(s): PROCALCITON, LATICACIDVEN in the last 168 hours.  Recent Results (from the past 240 hour(s))  Respiratory Panel by RT PCR (Flu Marria Mathison&B, Covid) - Nasopharyngeal Swab     Status: None   Collection Time: 07/27/19  3:06 AM   Specimen: Nasopharyngeal Swab  Result Value Ref Range Status   SARS Coronavirus 2 by RT PCR NEGATIVE NEGATIVE Final    Comment: (NOTE) SARS-CoV-2 target nucleic acids are NOT DETECTED. The SARS-CoV-2 RNA is generally  detectable in upper respiratoy specimens during the acute phase of infection. The lowest concentration of SARS-CoV-2 viral copies this assay can detect is 131 copies/mL. Caeleigh Prohaska negative result does not preclude SARS-Cov-2 infection and should not be used as the sole basis for treatment or other patient management decisions. Cree Napoli negative result may occur with  improper specimen collection/handling, submission of specimen other than nasopharyngeal swab, presence of viral mutation(s) within the areas targeted by this assay, and inadequate number of viral copies (<131 copies/mL). Jarriel Papillion negative result must be combined with clinical observations, patient history, and epidemiological information. The expected result is Negative. Fact Sheet for Patients:  PinkCheek.be Fact Sheet for Healthcare Providers:  GravelBags.it This test is not yet ap proved or cleared by the [place] FDA and  has been authorized for detection and/or diagnosis of SARS-CoV-2 by FDA under an Emergency Use Authorization (EUA). This EUA will remain  in effect (meaning this test can be used) for the duration of the COVID-19 declaration under Section 564(b)(1) of the Act, 21 U.S.C. section 360bbb-3(b)(1), unless the authorization is terminated or revoked sooner.    Influenza Tama Grosz by PCR NEGATIVE  NEGATIVE Final   Influenza B by PCR NEGATIVE NEGATIVE Final    Comment: (NOTE) The Xpert Xpress SARS-CoV-2/FLU/RSV assay is intended as an aid in  the diagnosis of influenza from Nasopharyngeal swab specimens and  should not be used as Aqueelah Cotrell sole basis for treatment. Nasal washings and  aspirates are unacceptable for Xpert Xpress SARS-CoV-2/FLU/RSV  testing. Fact Sheet for Patients: PinkCheek.be Fact Sheet for Healthcare Providers: GravelBags.it This test is not yet approved or cleared by the [place] FDA and  has been authorized for detection and/or diagnosis of SARS-CoV-2 by  FDA under an Emergency Use Authorization (EUA). This EUA will remain  in effect (meaning this test can be used) for the duration of the  Covid-19 declaration under Section 564(b)(1) of the Act, 21  U.S.C. section 360bbb-3(b)(1), unless the authorization is  terminated or revoked. Performed at [facility], [place] [address] [place], [place] [address]   Aerobic/Anaerobic Culture (surgical/deep wound)     Status: None   Collection Time: 07/27/19 10:55 AM   Specimen: Abdomen; Abscess  Result Value Ref Range Status   Specimen Description ABDOMEN SYRINGE  Final   Special Requests RIGHT LOWER QUADRANT  Final   Gram Stain   Final    ABUNDANT WBC PRESENT, PREDOMINANTLY PMN ABUNDANT GRAM NEGATIVE RODS ABUNDANT GRAM POSITIVE COCCI ABUNDANT GRAM POSITIVE RODS    Culture   Final    FEW KLEBSIELLA PNEUMONIAE ABUNDANT STREPTOCOCCUS CONSTELLATUS ABUNDANT BACTEROIDES CACCAE BETA LACTAMASE POSITIVE Performed at [facility], [place] [address] [place], [place] [address]    Report Status 07/30/2019 FINAL  Final   Organism ID, Bacteria KLEBSIELLA PNEUMONIAE  Final   Organism ID, Bacteria STREPTOCOCCUS CONSTELLATUS  Final      Susceptibility   Streptococcus constellatus - MIC*    PENICILLIN 0.25 INTERMEDIATE Intermediate     CEFTRIAXONE 1 SENSITIVE  Sensitive     ERYTHROMYCIN <=0.12 SENSITIVE Sensitive     LEVOFLOXACIN <=0.25 SENSITIVE Sensitive     VANCOMYCIN 0.5 SENSITIVE Sensitive     * ABUNDANT STREPTOCOCCUS CONSTELLATUS   Klebsiella pneumoniae - MIC*    AMPICILLIN RESISTANT Resistant     CEFAZOLIN <=4 SENSITIVE Sensitive     CEFEPIME <=0.12 SENSITIVE Sensitive     CEFTAZIDIME <=1 SENSITIVE Sensitive     CEFTRIAXONE <=0.25 SENSITIVE Sensitive     CIPROFLOXACIN <=  0.25 SENSITIVE Sensitive     GENTAMICIN <=1 SENSITIVE Sensitive     IMIPENEM <=0.25 SENSITIVE Sensitive     TRIMETH/SULFA <=20 SENSITIVE Sensitive     AMPICILLIN/SULBACTAM 4 SENSITIVE Sensitive     PIP/TAZO <=4 SENSITIVE Sensitive     * FEW KLEBSIELLA PNEUMONIAE         Radiology Studies: No results found.      Scheduled Meds: . acetaminophen  1,000 mg Oral Q6H  . amLODipine  10 mg Oral Daily  . hydrochlorothiazide  12.5 mg Oral Daily  . methocarbamol  500 mg Oral TID  . metoprolol tartrate  25 mg Oral BID  . polyethylene glycol  17 g Oral Daily  . sodium chloride flush  5 mL Intracatheter Q8H   Continuous Infusions: . dextrose 5 % and 0.45% NaCl 50 mL/hr at 08/01/19 2048  . piperacillin-tazobactam (ZOSYN)  IV 3.375 g (08/02/19 1249)     LOS: 6 days    Time spent: over 62 min    Fayrene Helper, MD [facility]   To contact the attending provider between 7A-7P or the covering provider during after hours 7P-7A, please log into the web site [email or web address] and access using universal [place] password for that web site. If you do not have the password, please call the hospital operator.  08/02/2019, 3:43 PM

## 2019-08-02 NOTE — Progress Notes (Signed)
   Subjective/Chief Complaint: Feels like his pain is controlled with scheduled medications. Multiple loose BMs, non-bloody. Denies nausea, vomiting. Had sweats last night  Denies CP/SOB, denies dysuria   Objective: Vital signs in last 24 hours: Temp:  [97.8 F (36.6 C)-98.8 F (37.1 C)] 98.8 F (37.1 C) (03/29 0845) Pulse Rate:  [82-92] 87 (03/29 0845) Resp:  [18] 18 (03/29 0845) BP: (150-170)/(101-111) 164/111 (03/29 0845) SpO2:  [97 %-99 %] 98 % (03/29 0845) Last BM Date: 08/01/19  Intake/Output from previous day: 03/28 0701 - 03/29 0700 In: 1564.8 [P.O.:480; I.V.:903.9; IV Piggyback:180.9] Out: -  Intake/Output this shift: Total I/O In: 240 [P.O.:240] Out: 400 [Urine:400]  General appearance: cooperative Resp: clear to auscultation bilaterally Cardio: regular rate and rhythm GI: soft, moderate distention, min tenderness RLQ, drain with purulent output   Lab Results:  Recent Labs    08/01/19 0447 08/02/19 0537  WBC 15.6* 20.7*  HGB 12.6* 12.1*  HCT 37.5* 36.1*  PLT 475* 548*   BMET Recent Labs    08/01/19 0447 08/02/19 0537  NA 135 134*  K 3.8 4.0  CL 100 95*  CO2 24 28  GLUCOSE 118* 118*  BUN 15 11  CREATININE 0.94 0.93  CALCIUM 8.8* 8.4*   PT/INR No results for input(s): LABPROT, INR in the last 72 hours. ABG No results for input(s): PHART, HCO3 in the last 72 hours.  Invalid input(s): PCO2, PO2  Studies/Results: No results found.  Anti-infectives: Anti-infectives (From admission, onward)   Start     Dose/Rate Route Frequency Ordered Stop   07/27/19 1200  piperacillin-tazobactam (ZOSYN) IVPB 3.375 g     3.375 g 12.5 mL/hr over 240 Minutes Intravenous Every 8 hours 07/27/19 0403     07/27/19 0315  piperacillin-tazobactam (ZOSYN) IVPB 3.375 g     3.375 g 100 mL/hr over 30 Minutes Intravenous  Once 07/27/19 0305 07/27/19 0401      Assessment/Plan: Perforated appendicitis with abscess -ct 3/24 with ileus after drain placement, now  clinically resolved, ng out, tol diet -WBC up today to 20 from 15, CT abdomen and pelvis w/ contrast ordered  -Zosyn - Dr. Dwain Sarna discussed possible interval appy and will see back in office once discharged  Acute kidney injury -resolved HTN -norvasc and metoprolol per TRH VTE - scds, hold lovenox for possible procedure pending CT results  FEN - reg diet - make NPO until after CT    LOS: 6 days    Adam Phenix 08/02/2019

## 2019-08-02 NOTE — Progress Notes (Signed)
Dr. Lowell Guitar notified of CT results, He is calling surgery to discuss and notify.

## 2019-08-03 ENCOUNTER — Inpatient Hospital Stay (HOSPITAL_COMMUNITY): Payer: No Typology Code available for payment source

## 2019-08-03 ENCOUNTER — Encounter (HOSPITAL_COMMUNITY): Payer: Self-pay | Admitting: General Surgery

## 2019-08-03 LAB — COMPREHENSIVE METABOLIC PANEL
ALT: 58 U/L — ABNORMAL HIGH (ref 0–44)
AST: 35 U/L (ref 15–41)
Albumin: 2.6 g/dL — ABNORMAL LOW (ref 3.5–5.0)
Alkaline Phosphatase: 91 U/L (ref 38–126)
Anion gap: 13 (ref 5–15)
BUN: 13 mg/dL (ref 6–20)
CO2: 27 mmol/L (ref 22–32)
Calcium: 8.9 mg/dL (ref 8.9–10.3)
Chloride: 94 mmol/L — ABNORMAL LOW (ref 98–111)
Creatinine, Ser: 1 mg/dL (ref 0.61–1.24)
GFR calc Af Amer: >60 mL/min (ref >60)
GFR calc non Af Amer: >60 mL/min (ref >60)
Glucose, Bld: 99 mg/dL (ref 70–99)
Potassium: 4.2 mmol/L (ref 3.5–5.1)
Sodium: 134 mmol/L — ABNORMAL LOW (ref 135–145)
Total Bilirubin: 1.1 mg/dL (ref 0.3–1.2)
Total Protein: 6.2 g/dL — ABNORMAL LOW (ref 6.5–8.1)

## 2019-08-03 LAB — CBC WITH DIFFERENTIAL/PLATELET
Abs Immature Granulocytes: 0 10*3/uL (ref 0.00–0.07)
Basophils Absolute: 0 10*3/uL (ref 0.0–0.1)
Basophils Relative: 0 %
Eosinophils Absolute: 0.5 10*3/uL (ref 0.0–0.5)
Eosinophils Relative: 2 %
HCT: 37.5 % — ABNORMAL LOW (ref 39.0–52.0)
Hemoglobin: 12.6 g/dL — ABNORMAL LOW (ref 13.0–17.0)
Lymphocytes Relative: 11 %
Lymphs Abs: 3 10*3/uL (ref 0.7–4.0)
MCH: 30.4 pg (ref 26.0–34.0)
MCHC: 33.6 g/dL (ref 30.0–36.0)
MCV: 90.4 fL (ref 80.0–100.0)
Monocytes Absolute: 1.6 10*3/uL — ABNORMAL HIGH (ref 0.1–1.0)
Monocytes Relative: 6 %
Neutro Abs: 21.8 10*3/uL — ABNORMAL HIGH (ref 1.7–7.7)
Neutrophils Relative %: 81 %
Platelets: 620 10*3/uL — ABNORMAL HIGH (ref 150–400)
RBC: 4.15 MIL/uL — ABNORMAL LOW (ref 4.22–5.81)
RDW: 12.8 % (ref 11.5–15.5)
WBC: 26.9 10*3/uL — ABNORMAL HIGH (ref 4.0–10.5)
nRBC: 0 % (ref 0.0–0.2)
nRBC: 0 /100 WBC

## 2019-08-03 LAB — MAGNESIUM: Magnesium: 2 mg/dL (ref 1.7–2.4)

## 2019-08-03 LAB — PHOSPHORUS: Phosphorus: 3.7 mg/dL (ref 2.5–4.6)

## 2019-08-03 MED ORDER — KCL IN DEXTROSE-NACL 20-5-0.45 MEQ/L-%-% IV SOLN
INTRAVENOUS | Status: DC
  Filled 2019-08-03 (×2): qty 1000

## 2019-08-03 MED ORDER — MIDAZOLAM HCL 2 MG/2ML IJ SOLN
INTRAMUSCULAR | Status: AC
  Filled 2019-08-03: qty 4

## 2019-08-03 MED ORDER — FENTANYL CITRATE (PF) 100 MCG/2ML IJ SOLN
INTRAMUSCULAR | Status: AC
  Filled 2019-08-03: qty 4

## 2019-08-03 MED ORDER — MIDAZOLAM HCL 2 MG/2ML IJ SOLN
INTRAMUSCULAR | Status: AC | PRN
  Administered 2019-08-03: 1 mg via INTRAVENOUS

## 2019-08-03 MED ORDER — FENTANYL CITRATE (PF) 100 MCG/2ML IJ SOLN
INTRAMUSCULAR | Status: AC | PRN
  Administered 2019-08-03 (×2): 50 ug via INTRAVENOUS

## 2019-08-03 NOTE — Consult Note (Signed)
Referring Physician(s): Dr. Kae Heller  Supervising Physician: Jacqulynn Cadet  Patient Status:  [facility] - In-pt  Chief Complaint: Perforated appendicitis with abscess  Subjective:  "Somebody dropped the ball."   Patient very frustrated that he has been NPO for over 24 hrs for CT scan and possible drain placement.   Imaging shows new collection.  IR consulted for aspiration/drainage.  States his right-sided drain is leaking.  Reports pain overnight.   Allergies: Patient has no known allergies.  Medications: Prior to Admission medications   Not on File     Vital Signs: BP (!) 139/94 (BP Location: Right Arm)   Pulse 98   Temp 98.3 F (36.8 C) (Oral)   Resp 17   Ht 6' 1.5" (1.867 m)   Wt 152 lb (68.9 kg)   SpO2 98%   BMI 19.78 kg/m   Physical Exam  NAD, alert Abdomen: soft, mild mid-pelvic tenderness.  RLQ drain in place with purulent-appearing output. Does not flush-- flush comes through tract.  Does not aspirate.   Imaging: CT ABDOMEN PELVIS W CONTRAST  Result Date: 08/02/2019 CLINICAL DATA:  Elevated white blood cell count, history of perforated appendix with percutaneous drain placement. EXAM: CT ABDOMEN AND PELVIS WITH CONTRAST TECHNIQUE: Multidetector CT imaging of the abdomen and pelvis was performed using the standard protocol following bolus administration of intravenous contrast. CONTRAST:  150mL OMNIPAQUE IOHEXOL 300 MG/ML  SOLN COMPARISON:  07/28/2019 FINDINGS: Lower chest: Minimal atelectasis right lower lobe. No acute pleural or parenchymal lung disease. Hepatobiliary: No focal liver abnormality is seen. No gallstones, gallbladder wall thickening, or biliary dilatation. Pancreas: Unremarkable. No pancreatic ductal dilatation or surrounding inflammatory changes. Spleen: Normal in size without focal abnormality. Adrenals/Urinary Tract: Adrenal glands are unremarkable. Kidneys are normal, without renal calculi, focal lesion, or hydronephrosis. Bladder is  unremarkable. Stomach/Bowel: There is diffuse small bowel distension with scattered gas fluid levels, though decreased in caliber since prior study. Findings most consistent with ileus. Percutaneous drain right lower quadrant unchanged in position. Dilated inflamed appendix is again seen adjacent to the percutaneous drainage catheter. There is a new rim enhancing fluid collection in the lower pelvis, measuring 5.3 x 3.8 cm reference image 78 of series 3, consistent with abscess. This does not communicate with the percutaneous drain right lower quadrant. Vascular/Lymphatic: Scattered atherosclerosis of the aorta unchanged. No pathologic adenopathy. Reproductive: Prostate is unremarkable. Other: There is trace free fluid throughout the abdomen and pelvis, greatest in the paracolic gutters. Central mesenteric edema most pronounced in the lower pelvis. Punctate foci of intraperitoneal gas are seen, compatible with percutaneous drain right lower quadrant. Musculoskeletal: No acute or destructive bony lesions. Reconstructed images demonstrate no additional findings. IMPRESSION: 1. New abscess within the lower central pelvis, measuring 5.3 x 3.8 cm. This does not communicate with the indwelling drainage catheter right lower quadrant. 2. Persistent changes of acute appendicitis, with percutaneous drainage catheter right lower quadrant. 3. Mesenteric edema with trace intra-abdominal ascites. 4. Continued ileus, though decreased caliber of the small bowel since prior study. These results will be called to the ordering clinician or representative by the Radiologist Assistant, and communication documented in the PACS or [employer]. Electronically Signed   By: Randa Ngo M.D.   On: 08/02/2019 17:11    Labs:  CBC: Recent Labs    07/31/19 0024 08/01/19 0447 08/02/19 0537 08/03/19 0640  WBC 8.7 15.6* 20.7* 26.9*  HGB 12.3* 12.6* 12.1* 12.6*  HCT 37.9* 37.5* 36.1* 37.5*  PLT 398 475* 548* 620*  COAGS:  Recent Labs    07/27/19 0506  INR 1.3*    BMP: Recent Labs    07/31/19 0024 08/01/19 0447 08/02/19 0537 08/03/19 0640  NA 138 135 134* 134*  K 4.6 3.8 4.0 4.2  CL 103 100 95* 94*  CO2 25 24 28 27   GLUCOSE 143* 118* 118* 99  BUN 20 15 11 13   CALCIUM 8.7* 8.8* 8.4* 8.9  CREATININE 0.98 0.94 0.93 1.00  GFRNONAA >60 >60 >60 >60  GFRAA >60 >60 >60 >60    LIVER FUNCTION TESTS: Recent Labs    07/26/19 1720 08/01/19 0447 08/02/19 0537 08/03/19 0640  BILITOT 0.8 0.8 0.6 1.1  AST 14* 26 30 35  ALT 9 32 45* 58*  ALKPHOS 75 66 65 91  PROT 8.3* 6.1* 5.5* 6.2*  ALBUMIN 3.5 2.5* 2.4* 2.6*    Assessment and Plan: Perforated appendicitis with abscess s/p RLQ drain placement 07/27/19 by Dr. 08/05/19 Patient with increased abdominal pain yesterday. WBC increased from 8  20.7 yesterday.  New CT obtained by surgery and shows: 1. New abscess within the lower central pelvis, measuring 5.3 x 3.8 cm. This does not communicate with the indwelling drainage catheter right lower quadrant. 2. Persistent changes of acute appendicitis, with percutaneous drainage catheter right lower quadrant. 3. Mesenteric edema with trace intra-abdominal ascites. 4. Continued ileus, though decreased caliber of the small bowel since prior study.  Remains afebrile.  On Zosyn. Culture from prior aspiration/drainage showed streptococcus constellatus, bacteroides caccae, and few klebsiella.  Case reviewed by Dr. 07/29/19 who approves patient for additional drain placement in CT as schedule allows.  He has been NPO.  Lovenox held.   His RLQ drain may need eval vs. Removal based on low output with poor function.   Risks and benefits discussed with the patient including bleeding, infection, damage to adjacent structures, bowel perforation/fistula connection, and sepsis.  All of the patient's questions were answered, patient is agreeable to proceed. Consent signed and in chart.  Electronically Signed:  Deanne Coffer, PA 08/03/2019, 12:44 PM   I spent a total of 15 Minutes at the the patient's bedside AND on the patient's hospital floor or unit, greater than 50% of which was counseling/coordinating care for intra-abdominal fluid collection.

## 2019-08-03 NOTE — Procedures (Signed)
Interventional Radiology Procedure Note  Procedure: Placement of a right transgluteal 16F drain with aspiration of 20 mL thick, purulent fluid.    Complications: None  Estimated Blood Loss: None  Recommendations: - Drain to JP - Flush Q shift - Cultures sent  Signed,  Sterling Big, MD

## 2019-08-03 NOTE — Progress Notes (Signed)
PROGRESS NOTE    Russell Wells  DEY:[id number] DOB: [birth year] DOA: 07/26/2019 PCP: Patient, No Pcp Per   Brief Narrative:  Russell Wells is an 49 y.o. male without known PMH who presented on 3/22 with perforated appendicitis.  He reports that he works overnight at [employer] and he developed sharp pain in his abdomen.  He drank Hydie Langan cold drink but the sharp pain in his RLQ was still ongoing.  He finished working, went home, and took Advil and tried to rest.  He slept all day and it was Dandra Shambaugh little better than night when he got up.  About 3AM "all hell broke loose."  The pain was out of control.  He finally came to the ER on 3/23 and they put Merlin Golden drain in.  Since the drain was placed, intermittent pain that is well controlled with the medication.  He was NPO for 2 days, yesterday he ate something with clear liquids and plan for advancing diet today.  He had physicals every year and was told to watch his BP.  He has never been on medication.  No h/o chronic medical problems.  Assessment & Plan:   Principal Problem:   Perforated appendicitis Active Problems:   Essential hypertension  Perforated appendicitis -Patient admitted by surgery with placement of pigtail drainage catheter in RLQ -Developed Keslee Harrington post-operative ileus; NG tube now out and currently on regular diet - cx with klebsiella pneumoniae (resistant to ampicillin), strep constellatus (intermediate to penicillin), bacteroides caccae -Continues on Zosyn -Pain management with PO medications now -Advancing diet -Patient should be encouraged to get OOB and ambulate -WBC count up -> CT scan shows new 5.3 x 3.8 cm abscess within central lower pelvis -> IR planning for CT guided drain placement -He may be able to d/c soon with plan for outpatient surgery f/u and likely subsequent appendectomy  AKI -Prerenal azotemia in the setting of intraabdominal infection -Resolved  HTN -No prior formal diagnosis but he has been told to monitor  his BP for years due to elevated BPs and +FH -attention to possible effect of pain on BP - BP improving gradually with new regimen below -Increase norvasc to 10 mg, continue metoprolol 25 mg BID and HCTZ 12.5.  Adjust as needed. -Continue prn Hydralazine -Will need PCP for outpatient f/u - will request TOC team consult  Hypomagnesemia: replace and follow  DVT prophylaxis: lovenox Code Status: full Family Communication: none at bedside Disposition Plan:  . Patient came from: home            . Anticipated d/c place:home . Barriers to d/c OR conditions which need to be met to effect Kassity Woodson safe d/c: discharge by surgery, primary team   Consultants:   Surgery is primary  Procedures:  RLQ abscess drain catheter placement on 3/23 by IR  Antimicrobials:  Anti-infectives (From admission, onward)   Start     Dose/Rate Route Frequency Ordered Stop   07/27/19 1200  piperacillin-tazobactam (ZOSYN) IVPB 3.375 g     3.375 g 12.5 mL/hr over 240 Minutes Intravenous Every 8 hours 07/27/19 0403     07/27/19 0315  piperacillin-tazobactam (ZOSYN) IVPB 3.375 g     3.375 g 100 mL/hr over 30 Minutes Intravenous  Once 07/27/19 0305 07/27/19 0401     Subjective: C/o some abdominal pain Otherwise doing ok  Objective: Vitals:   08/02/19 1938 08/03/19 0310 08/03/19 0835 08/03/19 0837  BP: (!) 161/97 (!) 141/93 (!) 139/94 (!) 139/94  Pulse: 96 95 98 98  Resp:  _0 Temp: 98.3 F (36.8 C) 98.1 F (36.7 C) 98.3 F (36.8 C) 98.3 F (36.8 C)  TempSrc:  Oral Oral Oral  SpO2: 98% 98% 98% 98%  Weight:      Height:        Intake/Output Summary (Last 24 hours) at 08/03/2019 1345 Last data filed at 08/03/2019 0645 Gross per 24 hour  Intake 490 ml  Output 1600 ml  Net -1110 ml   Filed Weights   07/26/19 1709  Weight: 68.9 kg    Examination:  General: No acute distress. Cardiovascular: Heart sounds show Uma Jerde regular rate, and rhythm.  Lungs: Clear to auscultation bilaterally  Abdomen:  Soft, ttp lower abdomen, nondistended - drain in place Neurological: Alert and oriented 3. Moves all extremities 4 with equal strength. Cranial nerves II through XII grossly intact. Skin: Warm and dry. No rashes or lesions. Extremities: No clubbing or cyanosis. No edema.   Data Reviewed: I have personally reviewed following labs and imaging studies  CBC: Recent Labs  Lab 07/29/19 0504 07/31/19 0024 08/01/19 0447 08/02/19 0537 08/03/19 0640  WBC 10.0 8.7 15.6* 20.7* 26.9*  NEUTROABS  --   --   --  14.4* 21.8*  HGB 12.8* 12.3* 12.6* 12.1* 12.6*  HCT 39.9 37.9* 37.5* 36.1* 37.5*  MCV 95.2 93.6 91.5 91.2 90.4  PLT 396 398 475* 548* 263*   Basic Metabolic Panel: Recent Labs  Lab 07/30/19 0330 07/31/19 0024 08/01/19 0447 08/02/19 0537 08/03/19 0640  NA 141 138 135 134* 134*  K 4.4 4.6 3.8 4.0 4.2  CL 106 103 100 95* 94*  CO2 _1 GLUCOSE 131* 143* 118* 118* 99  BUN 26* _2 CREATININE 1.10 0.98 0.94 0.93 1.00  CALCIUM 8.6* 8.7* 8.8* 8.4* 8.9  MG  --   --  1.5* 1.6* 2.0  PHOS  --   --  2.6 3.2 3.7   GFR: Estimated Creatinine Clearance: 87.1 mL/min (by C-G formula based on SCr of 1 mg/dL). Liver Function Tests: Recent Labs  Lab 08/01/19 0447 08/02/19 0537 08/03/19 0640  AST 26 30 35  ALT 32 45* 58*  ALKPHOS 66 65 91  BILITOT 0.8 0.6 1.1  PROT 6.1* 5.5* 6.2*  ALBUMIN 2.5* 2.4* 2.6*   No results for input(s): LIPASE, AMYLASE in the last 168 hours. No results for input(s): AMMONIA in the last 168 hours. Coagulation Profile: No results for input(s): INR, PROTIME in the last 168 hours. Cardiac Enzymes: No results for input(s): CKTOTAL, CKMB, CKMBINDEX, TROPONINI in the last 168 hours. BNP (last 3 results) No results for input(s): PROBNP in the last 8760 hours. HbA1C: No results for input(s): HGBA1C in the last 72 hours. CBG: No results for input(s): GLUCAP in the last 168 hours. Lipid Profile: No results for input(s): CHOL, HDL, LDLCALC,  TRIG, CHOLHDL, LDLDIRECT in the last 72 hours. Thyroid Function Tests: No results for input(s): TSH, T4TOTAL, FREET4, T3FREE, THYROIDAB in the last 72 hours. Anemia Panel: No results for input(s): VITAMINB12, FOLATE, FERRITIN, TIBC, IRON, RETICCTPCT in the last 72 hours. Sepsis Labs: No results for input(s): PROCALCITON, LATICACIDVEN in the last 168 hours.  Recent Results (from the past 240 hour(s))  Respiratory Panel by RT PCR (Flu Clarrisa Kaylor&B, Covid) - Nasopharyngeal Swab     Status: None   Collection Time: 07/27/19  3:06 AM   Specimen: Nasopharyngeal Swab  Result Value Ref Range Status   SARS Coronavirus 2 by RT PCR  NEGATIVE NEGATIVE Final    Comment: (NOTE) SARS-CoV-2 target nucleic acids are NOT DETECTED. The SARS-CoV-2 RNA is generally detectable in upper respiratoy specimens during the acute phase of infection. The lowest concentration of SARS-CoV-2 viral copies this assay can detect is 131 copies/mL. Manhattan Mccuen negative result does not preclude SARS-Cov-2 infection and should not be used as the sole basis for treatment or other patient management decisions. Fawna Cranmer negative result may occur with  improper specimen collection/handling, submission of specimen other than nasopharyngeal swab, presence of viral mutation(s) within the areas targeted by this assay, and inadequate number of viral copies (<131 copies/mL). Thimothy Barretta negative result must be combined with clinical observations, patient history, and epidemiological information. The expected result is Negative. Fact Sheet for Patients:  PinkCheek.be Fact Sheet for Healthcare Providers:  GravelBags.it This test is not yet ap proved or cleared by the [place] FDA and  has been authorized for detection and/or diagnosis of SARS-CoV-2 by FDA under an Emergency Use Authorization (EUA). This EUA will remain  in effect (meaning this test can be used) for the duration of the COVID-19 declaration  under Section 564(b)(1) of the Act, 21 U.S.C. section 360bbb-3(b)(1), unless the authorization is terminated or revoked sooner.    Influenza Graceson Nichelson by PCR NEGATIVE NEGATIVE Final   Influenza B by PCR NEGATIVE NEGATIVE Final    Comment: (NOTE) The Xpert Xpress SARS-CoV-2/FLU/RSV assay is intended as an aid in  the diagnosis of influenza from Nasopharyngeal swab specimens and  should not be used as Dakari Cregger sole basis for treatment. Nasal washings and  aspirates are unacceptable for Xpert Xpress SARS-CoV-2/FLU/RSV  testing. Fact Sheet for Patients: PinkCheek.be Fact Sheet for Healthcare Providers: GravelBags.it This test is not yet approved or cleared by the [place] FDA and  has been authorized for detection and/or diagnosis of SARS-CoV-2 by  FDA under an Emergency Use Authorization (EUA). This EUA will remain  in effect (meaning this test can be used) for the duration of the  Covid-19 declaration under Section 564(b)(1) of the Act, 21  U.S.C. section 360bbb-3(b)(1), unless the authorization is  terminated or revoked. Performed at [facility], [place] [address] [place], [place] [address]   Aerobic/Anaerobic Culture (surgical/deep wound)     Status: None   Collection Time: 07/27/19 10:55 AM   Specimen: Abdomen; Abscess  Result Value Ref Range Status   Specimen Description ABDOMEN SYRINGE  Final   Special Requests RIGHT LOWER QUADRANT  Final   Gram Stain   Final    ABUNDANT WBC PRESENT, PREDOMINANTLY PMN ABUNDANT GRAM NEGATIVE RODS ABUNDANT GRAM POSITIVE COCCI ABUNDANT GRAM POSITIVE RODS    Culture   Final    FEW KLEBSIELLA PNEUMONIAE ABUNDANT STREPTOCOCCUS CONSTELLATUS ABUNDANT BACTEROIDES CACCAE BETA LACTAMASE POSITIVE Performed at [facility], [place] [address] [place], [place] [address]    Report Status 07/30/2019 FINAL  Final   Organism ID, Bacteria KLEBSIELLA PNEUMONIAE  Final   Organism ID, Bacteria  STREPTOCOCCUS CONSTELLATUS  Final      Susceptibility   Streptococcus constellatus - MIC*    PENICILLIN 0.25 INTERMEDIATE Intermediate     CEFTRIAXONE 1 SENSITIVE Sensitive     ERYTHROMYCIN <=0.12 SENSITIVE Sensitive     LEVOFLOXACIN <=0.25 SENSITIVE Sensitive     VANCOMYCIN 0.5 SENSITIVE Sensitive     * ABUNDANT STREPTOCOCCUS CONSTELLATUS   Klebsiella pneumoniae - MIC*    AMPICILLIN RESISTANT Resistant     CEFAZOLIN <=4 SENSITIVE Sensitive     CEFEPIME <=0.12 SENSITIVE Sensitive  CEFTAZIDIME <=1 SENSITIVE Sensitive     CEFTRIAXONE <=0.25 SENSITIVE Sensitive     CIPROFLOXACIN <=0.25 SENSITIVE Sensitive     GENTAMICIN <=1 SENSITIVE Sensitive     IMIPENEM <=0.25 SENSITIVE Sensitive     TRIMETH/SULFA <=20 SENSITIVE Sensitive     AMPICILLIN/SULBACTAM 4 SENSITIVE Sensitive     PIP/TAZO <=4 SENSITIVE Sensitive     * FEW KLEBSIELLA PNEUMONIAE         Radiology Studies: CT ABDOMEN PELVIS W CONTRAST  Result Date: 08/02/2019 CLINICAL DATA:  Elevated white blood cell count, history of perforated appendix with percutaneous drain placement. EXAM: CT ABDOMEN AND PELVIS WITH CONTRAST TECHNIQUE: Multidetector CT imaging of the abdomen and pelvis was performed using the standard protocol following bolus administration of intravenous contrast. CONTRAST:  134m OMNIPAQUE IOHEXOL 300 MG/ML  SOLN COMPARISON:  07/28/2019 FINDINGS: Lower chest: Minimal atelectasis right lower lobe. No acute pleural or parenchymal lung disease. Hepatobiliary: No focal liver abnormality is seen. No gallstones, gallbladder wall thickening, or biliary dilatation. Pancreas: Unremarkable. No pancreatic ductal dilatation or surrounding inflammatory changes. Spleen: Normal in size without focal abnormality. Adrenals/Urinary Tract: Adrenal glands are unremarkable. Kidneys are normal, without renal calculi, focal lesion, or hydronephrosis. Bladder is unremarkable. Stomach/Bowel: There is diffuse small bowel distension with  scattered gas fluid levels, though decreased in caliber since prior study. Findings most consistent with ileus. Percutaneous drain right lower quadrant unchanged in position. Dilated inflamed appendix is again seen adjacent to the percutaneous drainage catheter. There is Kinjal Neitzke new rim enhancing fluid collection in the lower pelvis, measuring 5.3 x 3.8 cm reference image 78 of series 3, consistent with abscess. This does not communicate with the percutaneous drain right lower quadrant. Vascular/Lymphatic: Scattered atherosclerosis of the aorta unchanged. No pathologic adenopathy. Reproductive: Prostate is unremarkable. Other: There is trace free fluid throughout the abdomen and pelvis, greatest in the paracolic gutters. Central mesenteric edema most pronounced in the lower pelvis. Punctate foci of intraperitoneal gas are seen, compatible with percutaneous drain right lower quadrant. Musculoskeletal: No acute or destructive bony lesions. Reconstructed images demonstrate no additional findings. IMPRESSION: 1. New abscess within the lower central pelvis, measuring 5.3 x 3.8 cm. This does not communicate with the indwelling drainage catheter right lower quadrant. 2. Persistent changes of acute appendicitis, with percutaneous drainage catheter right lower quadrant. 3. Mesenteric edema with trace intra-abdominal ascites. 4. Continued ileus, though decreased caliber of the small bowel since prior study. These results will be called to the ordering clinician or representative by the Radiologist Assistant, and communication documented in the PACS or [employer] Electronically Signed   By: MRanda NgoM.D.   On: 08/02/2019 17:11        Scheduled Meds: . acetaminophen  1,000 mg Oral Q6H  . amLODipine  10 mg Oral Daily  . hydrochlorothiazide  12.5 mg Oral Daily  . methocarbamol  500 mg Oral TID  . metoprolol tartrate  25 mg Oral BID  . polyethylene glycol  17 g Oral Daily  . sodium chloride flush  5 mL  Intracatheter Q8H   Continuous Infusions: . dextrose 5 % and 0.45 % NaCl with KCl 20 mEq/L 75 mL/hr at 08/03/19 1001  . dextrose 5 % and 0.45% NaCl 50 mL/hr at 08/03/19 0434  . piperacillin-tazobactam (ZOSYN)  IV 3.375 g (08/03/19 1128)     LOS: 7 days    Time spent: over 30 min    CFayrene Helper MD [facility]   To contact the attending provider between 7A-7P or  the covering provider during after hours 7P-7A, please log into the web site [email or web address] and access using universal [place] password for that web site. If you do not have the password, please call the hospital operator.  08/03/2019, 1:45 PM

## 2019-08-03 NOTE — Progress Notes (Signed)
Subjective/Chief Complaint: Stable abdominal pain, had a BM yesterday, having flatus. Was frustrated that no one updated him sooner regarding his CT results and plan.   Objective: Vital signs in last 24 hours: Temp:  [98.1 F (36.7 C)-98.8 F (37.1 C)] 98.1 F (36.7 C) (03/30 0310) Pulse Rate:  [87-96] 95 (03/30 0310) Resp:  [16-19] 17 (03/30 0310) BP: (141-164)/(93-111) 141/93 (03/30 0310) SpO2:  [98 %-99 %] 98 % (03/30 0310) Last BM Date: 08/01/19  Intake/Output from previous day: 03/29 0701 - 03/30 0700 In: 730 [P.O.:720; I.V.:5] Out: 2000 [Urine:2000] Intake/Output this shift: No intake/output data recorded.  General appearance: cooperative Resp: clear to auscultation bilaterally Cardio: regular rate and rhythm GI: soft, moderate distention, min tenderness RLQ, drain with purulent output and some drainage around the drain.  Lab Results:  Recent Labs    08/01/19 0447 08/02/19 0537  WBC 15.6* 20.7*  HGB 12.6* 12.1*  HCT 37.5* 36.1*  PLT 475* 548*   BMET Recent Labs    08/01/19 0447 08/02/19 0537  NA 135 134*  K 3.8 4.0  CL 100 95*  CO2 24 28  GLUCOSE 118* 118*  BUN 15 11  CREATININE 0.94 0.93  CALCIUM 8.8* 8.4*   Studies/Results: CT ABDOMEN PELVIS W CONTRAST  Result Date: 08/02/2019 CLINICAL DATA:  Elevated white blood cell count, history of perforated appendix with percutaneous drain placement. EXAM: CT ABDOMEN AND PELVIS WITH CONTRAST TECHNIQUE: Multidetector CT imaging of the abdomen and pelvis was performed using the standard protocol following bolus administration of intravenous contrast. CONTRAST:  OMNIPAQUE IOHEXOL 300 MG/ML  SOLN COMPARISON:  07/28/2019 FINDINGS: Lower chest: Minimal atelectasis right lower lobe. No acute pleural or parenchymal lung disease. Hepatobiliary: No focal liver abnormality is seen. No gallstones, gallbladder wall thickening, or biliary dilatation. Pancreas: Unremarkable. No pancreatic ductal dilatation or  surrounding inflammatory changes. Spleen: Normal in size without focal abnormality. Adrenals/Urinary Tract: Adrenal glands are unremarkable. Kidneys are normal, without renal calculi, focal lesion, or hydronephrosis. Bladder is unremarkable. Stomach/Bowel: There is diffuse small bowel distension with scattered gas fluid levels, though decreased in caliber since prior study. Findings most consistent with ileus. Percutaneous drain right lower quadrant unchanged in position. Dilated inflamed appendix is again seen adjacent to the percutaneous drainage catheter. There is a new rim enhancing fluid collection in the lower pelvis, measuring 5.3 x 3.8 cm reference image 78 of series 3, consistent with abscess. This does not communicate with the percutaneous drain right lower quadrant. Vascular/Lymphatic: Scattered atherosclerosis of the aorta unchanged. No pathologic adenopathy. Reproductive: Prostate is unremarkable. Other: There is trace free fluid throughout the abdomen and pelvis, greatest in the paracolic gutters. Central mesenteric edema most pronounced in the lower pelvis. Punctate foci of intraperitoneal gas are seen, compatible with percutaneous drain right lower quadrant. Musculoskeletal: No acute or destructive bony lesions. Reconstructed images demonstrate no additional findings. IMPRESSION: 1. New abscess within the lower central pelvis, measuring 5.3 x 3.8 cm. This does not communicate with the indwelling drainage catheter right lower quadrant. 2. Persistent changes of acute appendicitis, with percutaneous drainage catheter right lower quadrant. 3. Mesenteric edema with trace intra-abdominal ascites. 4. Continued ileus, though decreased caliber of the small bowel since prior study. These results will be called to the ordering clinician or representative by the Radiologist Assistant, and communication documented in the PACS or [employer]. Electronically Signed   By: Sharlet Salina M.D.   On: 08/02/2019  17:11    Anti-infectives: Anti-infectives (From admission, onward)   Start  Dose/Rate Route Frequency Ordered Stop   07/27/19 1200  piperacillin-tazobactam (ZOSYN) IVPB 3.375 g     3.375 g 12.5 mL/hr over 240 Minutes Intravenous Every 8 hours 07/27/19 0403     07/27/19 0315  piperacillin-tazobactam (ZOSYN) IVPB 3.375 g     3.375 g 100 mL/hr over 30 Minutes Intravenous  Once 07/27/19 0305 07/27/19 0401      Assessment/Plan: Perforated appendicitis with abscess -ct 3/24 with ileus after drain placement, now clinically resolved, ng out, tol diet -WBC up today to 20 yesterday, AM labs pending; CT revealed a new 3.8x5 cm abscess in the pelvis. Consult IR for potential percutaneous drainage today. -Zosyn - Dr. Donne Wells discussed possible interval appy and will see back in office once discharged  Acute kidney injury -resolved HTN -norvasc and metoprolol per TRH VTE - scds, hold lovenox for possible procedure  FEN - NPO, IVF     LOS: 7 days    Russell Wells 08/03/2019

## 2019-08-03 NOTE — Progress Notes (Signed)
RLQ drain removed after placement of R transgluteal drain today in CT.  Drain removed in its entirety and without complication.   Loyce Dys, MS RD PA-C 3:09 PM

## 2019-08-03 NOTE — TOC Initial Note (Signed)
Transition of Care [facility]) - Initial/Assessment Note    Patient Details  Name: Russell Wells MRN: [id number] Date of Birth: 1970-12-23  [facility] CM/SW Contact:    Curlene Labrum, RN Phone Number: 08/03/2019, 12:04 PM  Clinical Narrative:                Tavone Caesar an 49 y.o.malewithout known PMH who presented on 3/22 with perforated appendicitis.  Case management met with the patient at the bedside - patient plans to have new pigtail drainage catheter to be placed today after S/P perforated appendix on 3/22.  Patient lives in a home in [place], [place] with family including sister and brother in [place].  Family will be providing transportation home upon discharge after medical work up.  Patient given information with my last visit with him in regards to primary care physician establishment.  Patient does not have a local pharmacy, but [facility] pharmacy may be a useful option prior to discharge.  Continued medical work up.      Patient Goals and CMS Choice        Expected Discharge Plan and Services                                                Prior Living Arrangements/Services                       Activities of Daily Living Home Assistive Devices/Equipment: None ADL Screening (condition at time of admission) Patient's cognitive ability adequate to safely complete daily activities?: Yes Is the patient deaf or have difficulty hearing?: No Does the patient have difficulty seeing, even when wearing glasses/contacts?: No Does the patient have difficulty concentrating, remembering, or making decisions?: No Patient able to express need for assistance with ADLs?: No Does the patient have difficulty dressing or bathing?: Yes Independently performs ADLs?: Yes (appropriate for developmental age) Does the patient have difficulty walking or climbing stairs?: No Weakness of Legs: None Weakness of Arms/Hands: None  Permission  Sought/Granted                  Emotional Assessment              Admission diagnosis:  Renal insufficiency [N28.9] Intra-abdominal abscess ([place]) [K65.1] Perforated appendicitis [K35.32] Acute appendicitis with appendiceal abscess [K35.33] Patient Active Problem List   Diagnosis Date Noted  . Essential hypertension 07/30/2019  . Perforated appendicitis 07/27/2019   PCP:  Patient, No Pcp Per Pharmacy:  No Pharmacies Listed    Social Determinants of Health (SDOH) Interventions    Readmission Risk Interventions Readmission Risk Prevention Plan 08/03/2019 07/30/2019  Post Dischage Appt Complete Complete  Medication Screening Complete Complete  Transportation Screening Complete Complete

## 2019-08-03 NOTE — Plan of Care (Signed)

## 2019-08-04 DIAGNOSIS — I10 Essential (primary) hypertension: Secondary | ICD-10-CM

## 2019-08-04 DIAGNOSIS — K3532 Acute appendicitis with perforation and localized peritonitis, without abscess: Secondary | ICD-10-CM

## 2019-08-04 DIAGNOSIS — K3533 Acute appendicitis with perforation and localized peritonitis, with abscess: Principal | ICD-10-CM

## 2019-08-04 LAB — CBC WITH DIFFERENTIAL/PLATELET
Abs Immature Granulocytes: 0.48 10*3/uL — ABNORMAL HIGH (ref 0.00–0.07)
Basophils Absolute: 0.1 10*3/uL (ref 0.0–0.1)
Basophils Relative: 0 %
Eosinophils Absolute: 0.3 10*3/uL (ref 0.0–0.5)
Eosinophils Relative: 1 %
HCT: 35.2 % — ABNORMAL LOW (ref 39.0–52.0)
Hemoglobin: 11.6 g/dL — ABNORMAL LOW (ref 13.0–17.0)
Immature Granulocytes: 2 %
Lymphocytes Relative: 13 %
Lymphs Abs: 3 10*3/uL (ref 0.7–4.0)
MCH: 30.4 pg (ref 26.0–34.0)
MCHC: 33 g/dL (ref 30.0–36.0)
MCV: 92.4 fL (ref 80.0–100.0)
Monocytes Absolute: 1.4 10*3/uL — ABNORMAL HIGH (ref 0.1–1.0)
Monocytes Relative: 6 %
Neutro Abs: 17.5 10*3/uL — ABNORMAL HIGH (ref 1.7–7.7)
Neutrophils Relative %: 78 %
Platelets: 593 10*3/uL — ABNORMAL HIGH (ref 150–400)
RBC: 3.81 MIL/uL — ABNORMAL LOW (ref 4.22–5.81)
RDW: 13 % (ref 11.5–15.5)
WBC: 22.7 10*3/uL — ABNORMAL HIGH (ref 4.0–10.5)
nRBC: 0 % (ref 0.0–0.2)

## 2019-08-04 LAB — COMPREHENSIVE METABOLIC PANEL
ALT: 63 U/L — ABNORMAL HIGH (ref 0–44)
AST: 36 U/L (ref 15–41)
Albumin: 2.5 g/dL — ABNORMAL LOW (ref 3.5–5.0)
Alkaline Phosphatase: 103 U/L (ref 38–126)
Anion gap: 12 (ref 5–15)
BUN: 14 mg/dL (ref 6–20)
CO2: 27 mmol/L (ref 22–32)
Calcium: 8.9 mg/dL (ref 8.9–10.3)
Chloride: 95 mmol/L — ABNORMAL LOW (ref 98–111)
Creatinine, Ser: 0.93 mg/dL (ref 0.61–1.24)
GFR calc Af Amer: >60 mL/min (ref >60)
GFR calc non Af Amer: >60 mL/min (ref >60)
Glucose, Bld: 168 mg/dL — ABNORMAL HIGH (ref 70–99)
Potassium: 4.3 mmol/L (ref 3.5–5.1)
Sodium: 134 mmol/L — ABNORMAL LOW (ref 135–145)
Total Bilirubin: 0.8 mg/dL (ref 0.3–1.2)
Total Protein: 5.9 g/dL — ABNORMAL LOW (ref 6.5–8.1)

## 2019-08-04 LAB — MAGNESIUM: Magnesium: 1.7 mg/dL (ref 1.7–2.4)

## 2019-08-04 LAB — PHOSPHORUS: Phosphorus: 2.7 mg/dL (ref 2.5–4.6)

## 2019-08-04 MED ORDER — ENOXAPARIN SODIUM 40 MG/0.4ML ~~LOC~~ SOLN
40.0000 mg | SUBCUTANEOUS | Status: DC
  Administered 2019-08-04 – 2019-08-05 (×2): 40 mg via SUBCUTANEOUS
  Filled 2019-08-04 (×2): qty 0.4

## 2019-08-04 MED ORDER — METOPROLOL TARTRATE 25 MG PO TABS: 25.0000 mg | ORAL_TABLET | Freq: Two times a day (BID) | ORAL | 1 refills | Status: DC

## 2019-08-04 MED ORDER — AMLODIPINE BESYLATE 10 MG PO TABS: 10.0000 mg | ORAL_TABLET | Freq: Every day | ORAL | 1 refills | Status: DC

## 2019-08-04 MED ORDER — HYDROCHLOROTHIAZIDE 12.5 MG PO CAPS: 12.5000 mg | ORAL_CAPSULE | Freq: Every day | ORAL | 1 refills | Status: DC

## 2019-08-04 NOTE — Progress Notes (Signed)
PROGRESS NOTE  Russell Wells JSE:[id number] DOB: 01-31-1971 DOA: 07/26/2019 PCP: Patient, No Pcp Per   LOS: 8 days   Brief Narrative / Interim history: Russell Robinsonis an 49 y.o.malewithout known PMH who presented on 3/22 with perforated appendicitis.He reports that he works overnight at [employer] and he developed sharp pain in his abdomen. He drank a cold drink but the sharp pain in his RLQ was still ongoing. He finished working, went home, and took Advil and tried to rest. He slept all day and it was a little better than night when he got up. About 3AM "all hell broke loose."  He had physicals every year and was told to watch his BP. He has never been on medication. No h/o chronic medical problems.  Subjective / 24h Interval events: Denies any abdominal pain this morning, no nausea no vomiting, no chest pain, no shortness of breath  Assessment & Plan: Principal Problem Essential hypertension -Has a history of slightly elevated blood pressure and has been told that an outpatient to monitor his BP for several years.  Currently started on amlodipine 10 mg, HCTZ 12.5 as well as metoprolol 25 twice daily with good blood pressure control today, continue current regimen.  Will prescribe these medications on discharge  Active Problems Perforated appendicitis/intra-abdominal abscess -He is admitted on surgical service and had a pigtail drainage catheter in the right lower quadrant which has been removed -His white count increased over the last few days and underwent a CT scan on 3/29 which revealed a new 3.8 x 5 cm abscess in the pelvis, status post right transgluteal drain on 3/30 -WBC improving -Cultures initially obtained on 3/23 showed Klebsiella, Streptococcus Constellatus, bacteroides caccae.  Currently on Zosyn.  It is sensitive to Augmentin and if WBC is improving and he remains afebrile per general surgery notes he may be discharged home tomorrow  Acute kidney  injury -Prerenal, resolved with fluids   Scheduled Meds: . acetaminophen  1,000 mg Oral Q6H  . amLODipine  10 mg Oral Daily  . enoxaparin (LOVENOX) injection  40 mg Subcutaneous Q24H  . hydrochlorothiazide  12.5 mg Oral Daily  . methocarbamol  500 mg Oral TID  . metoprolol tartrate  25 mg Oral BID  . polyethylene glycol  17 g Oral Daily  . sodium chloride flush  5 mL Intracatheter Q8H   Continuous Infusions: . piperacillin-tazobactam (ZOSYN)  IV 3.375 g (08/04/19 0324)   PRN Meds:.diphenhydrAMINE **OR** diphenhydrAMINE, HYDROmorphone (DILAUDID) injection, metoprolol tartrate, ondansetron **OR** ondansetron (ZOFRAN) IV, oxyCODONE, simethicone  DVT prophylaxis: Lovenox Code Status: Full code Family Communication: d/w patient  Patient admitted from: home Anticipated d/c place: home Barriers to d/c: Still has persistent WBC, is improving and if there is continued improvement potentially to be discharged within 24-48 hours  Procedures:  Right lower quadrant abscess drain 3/23 by IR (now removed), pelvic drain 3/30 by IR  Antimicrobials: On Zosyn    Objective: Vitals:   08/03/19 1803 08/03/19 1955 08/04/19 0307 08/04/19 0730  BP: 129/84 126/81 120/74 134/90  Pulse: 90 92 77 78  Resp: 16 16 17 17   Temp: 98.3 F (36.8 C) 98.5 F (36.9 C) 98.7 F (37.1 C) 97.8 F (36.6 C)  TempSrc: Oral Oral Oral Oral  SpO2: 97% 98% 98% 99%  Weight:      Height:        Intake/Output Summary (Last 24 hours) at 08/04/2019 1117 Last data filed at 08/04/2019 08/06/2019 Gross per 24 hour  Intake 1495 ml  Output 270  ml  Net 1225 ml   Filed Weights   07/26/19 1709  Weight: 68.9 kg    Examination:  Constitutional: NAD Eyes: no scleral icterus ENMT: Mucous membranes are moist.  Neck: normal, supple Respiratory: clear to auscultation bilaterally, no wheezing, no crackles.  Cardiovascular: Regular rate and rhythm, no murmurs / rubs / gallops. No LE edema. Abdomen: non distended, no  tenderness. Bowel sounds positive.  Musculoskeletal: no clubbing / cyanosis.  Skin: no rashes Neurologic: CN 2-12 grossly intact. Strength 5/5 in all 4.   Data Reviewed: I have independently reviewed following labs and imaging studies   CBC: Recent Labs  Lab 07/31/19 0024 08/01/19 0447 08/02/19 0537 08/03/19 0640 08/04/19 0515  WBC 8.7 15.6* 20.7* 26.9* 22.7*  NEUTROABS  --   --  14.4* 21.8* 17.5*  HGB 12.3* 12.6* 12.1* 12.6* 11.6*  HCT 37.9* 37.5* 36.1* 37.5* 35.2*  MCV 93.6 91.5 91.2 90.4 92.4  PLT 398 475* 548* 620* 593*   Basic Metabolic Panel: Recent Labs  Lab 07/31/19 0024 08/01/19 0447 08/02/19 0537 08/03/19 0640 08/04/19 0515  NA 138 135 134* 134* 134*  K 4.6 3.8 4.0 4.2 4.3  CL 103 100 95* 94* 95*  CO2 25 24 28 27 27   GLUCOSE 143* 118* 118* 99 168*  BUN 20 15 11 13 14   CREATININE 0.98 0.94 0.93 1.00 0.93  CALCIUM 8.7* 8.8* 8.4* 8.9 8.9  MG  --  1.5* 1.6* 2.0 1.7  PHOS  --  2.6 3.2 3.7 2.7   Liver Function Tests: Recent Labs  Lab 08/01/19 0447 08/02/19 0537 08/03/19 0640 08/04/19 0515  AST 26 30 35 36  ALT 32 45* 58* 63*  ALKPHOS 66 65 91 103  BILITOT 0.8 0.6 1.1 0.8  PROT 6.1* 5.5* 6.2* 5.9*  ALBUMIN 2.5* 2.4* 2.6* 2.5*   Coagulation Profile: No results for input(s): INR, PROTIME in the last 168 hours. HbA1C: No results for input(s): HGBA1C in the last 72 hours. CBG: No results for input(s): GLUCAP in the last 168 hours.  Recent Results (from the past 240 hour(s))  Respiratory Panel by RT PCR (Flu A&B, Covid) - Nasopharyngeal Swab     Status: None   Collection Time: 07/27/19  3:06 AM   Specimen: Nasopharyngeal Swab  Result Value Ref Range Status   SARS Coronavirus 2 by RT PCR NEGATIVE NEGATIVE Final    Comment: (NOTE) SARS-CoV-2 target nucleic acids are NOT DETECTED. The SARS-CoV-2 RNA is generally detectable in upper respiratoy specimens during the acute phase of infection. The lowest concentration of SARS-CoV-2 viral copies this assay  can detect is 131 copies/mL. A negative result does not preclude SARS-Cov-2 infection and should not be used as the sole basis for treatment or other patient management decisions. A negative result may occur with  improper specimen collection/handling, submission of specimen other than nasopharyngeal swab, presence of viral mutation(s) within the areas targeted by this assay, and inadequate number of viral copies (<131 copies/mL). A negative result must be combined with clinical observations, patient history, and epidemiological information. The expected result is Negative. Fact Sheet for Patients:  08/06/19 Fact Sheet for Healthcare Providers:  07/29/19 This test is not yet ap proved or cleared by the https://www.moore.com/ FDA and  has been authorized for detection and/or diagnosis of SARS-CoV-2 by FDA under an Emergency Use Authorization (EUA). This EUA will remain  in effect (meaning this test can be used) for the duration of the COVID-19 declaration under Section 564(b)(1) of the Act, 21  U.S.C. section 360bbb-3(b)(1), unless the authorization is terminated or revoked sooner.    Influenza A by PCR NEGATIVE NEGATIVE Final   Influenza B by PCR NEGATIVE NEGATIVE Final    Comment: (NOTE) The Xpert Xpress SARS-CoV-2/FLU/RSV assay is intended as an aid in  the diagnosis of influenza from Nasopharyngeal swab specimens and  should not be used as a sole basis for treatment. Nasal washings and  aspirates are unacceptable for Xpert Xpress SARS-CoV-2/FLU/RSV  testing. Fact Sheet for Patients: https://www.moore.com/ Fact Sheet for Healthcare Providers: https://www.young.biz/ This test is not yet approved or cleared by the [place] FDA and  has been authorized for detection and/or diagnosis of SARS-CoV-2 by  FDA under an Emergency Use Authorization (EUA). This EUA will remain  in effect  (meaning this test can be used) for the duration of the  Covid-19 declaration under Section 564(b)(1) of the Act, 21  U.S.C. section 360bbb-3(b)(1), unless the authorization is  terminated or revoked. Performed at [facility], [address] [place], [place] [address]   Aerobic/Anaerobic Culture (surgical/deep wound)     Status: None   Collection Time: 07/27/19 10:55 AM   Specimen: Abdomen; Abscess  Result Value Ref Range Status   Specimen Description ABDOMEN SYRINGE  Final   Special Requests RIGHT LOWER QUADRANT  Final   Gram Stain   Final    ABUNDANT WBC PRESENT, PREDOMINANTLY PMN ABUNDANT GRAM NEGATIVE RODS ABUNDANT GRAM POSITIVE COCCI ABUNDANT GRAM POSITIVE RODS    Culture   Final    FEW KLEBSIELLA PNEUMONIAE ABUNDANT STREPTOCOCCUS CONSTELLATUS ABUNDANT BACTEROIDES CACCAE BETA LACTAMASE POSITIVE Performed at [facility], [address] [place], [place] [address]    Report Status 07/30/2019 FINAL  Final   Organism ID, Bacteria KLEBSIELLA PNEUMONIAE  Final   Organism ID, Bacteria STREPTOCOCCUS CONSTELLATUS  Final      Susceptibility   Streptococcus constellatus - MIC*    PENICILLIN 0.25 INTERMEDIATE Intermediate     CEFTRIAXONE 1 SENSITIVE Sensitive     ERYTHROMYCIN <=0.12 SENSITIVE Sensitive     LEVOFLOXACIN <=0.25 SENSITIVE Sensitive     VANCOMYCIN 0.5 SENSITIVE Sensitive     * ABUNDANT STREPTOCOCCUS CONSTELLATUS   Klebsiella pneumoniae - MIC*    AMPICILLIN RESISTANT Resistant     CEFAZOLIN <=4 SENSITIVE Sensitive     CEFEPIME <=0.12 SENSITIVE Sensitive     CEFTAZIDIME <=1 SENSITIVE Sensitive     CEFTRIAXONE <=0.25 SENSITIVE Sensitive     CIPROFLOXACIN <=0.25 SENSITIVE Sensitive     GENTAMICIN <=1 SENSITIVE Sensitive     IMIPENEM <=0.25 SENSITIVE Sensitive     TRIMETH/SULFA <=20 SENSITIVE Sensitive     AMPICILLIN/SULBACTAM 4 SENSITIVE Sensitive     PIP/TAZO <=4 SENSITIVE Sensitive     * FEW KLEBSIELLA PNEUMONIAE  Aerobic/Anaerobic Culture  (surgical/deep wound)     Status: None (Preliminary result)   Collection Time: 08/03/19  3:00 PM   Specimen: Abscess  Result Value Ref Range Status   Specimen Description ABSCESS  Final   Special Requests RLQ DRAINAGE  Final   Gram Stain   Final    ABUNDANT WBC PRESENT, PREDOMINANTLY PMN RARE GRAM VARIABLE ROD    Culture   Final    CULTURE REINCUBATED FOR BETTER GROWTH Performed at [facility], [address] [place], [place] [address]    Report Status PENDING  Incomplete     Radiology Studies: CT IMAGE GUIDED DRAINAGE BY PERCUTANEOUS CATHETER  Result Date: 08/03/2019 INDICATION: 48 year old male with a history of perforated  appendicitis and intra-abdominal abscess formation. He had a CT-guided drainage catheter placed in the right lower quadrant by interventional radiology on 07/27/2019. Unfortunately, now he has a peripherally enhancing fluid collection in the pelvic cul-de-sac concerning for a new abscess. His prior abscess catheter was no longer draining and was subsequently removed. EXAM: CT-guided drain placement MEDICATIONS: The patient is currently admitted to the hospital and receiving intravenous antibiotics. The antibiotics were administered within an appropriate time frame prior to the initiation of the procedure. ANESTHESIA/SEDATION: Fentanyl 100 mcg IV; Versed 1 mg IV Moderate Sedation Time:  16 minutes The patient was continuously monitored during the procedure by the interventional radiology nurse under my direct supervision. COMPLICATIONS: None immediate. PROCEDURE: Informed written consent was obtained from the patient after a thorough discussion of the procedural risks, benefits and alternatives. All questions were addressed. Maximal Sterile Barrier Technique was utilized including caps, mask, sterile gowns, sterile gloves, sterile drape, hand hygiene and skin antiseptic. A timeout was performed prior to the initiation of the procedure. A planning axial CT scan was  performed. The fluid collection in the pelvic cul-de-sac was localized. A suitable skin entry site was selected and marked. The overlying skin was sterilely prepped and draped in the standard fashion using chlorhexidine skin prep. Local anesthesia was attained by infiltration with 1% lidocaine. A small dermatotomy was made. Under intermittent CT guidance, an 18 gauge trocar needle was carefully advanced via a right transgluteal parasacral approach into the fluid collection. A 0.035 wire was then coiled in the fluid collection. The trocar needle was removed. The skin tract was dilated to 10 [place]. A Cook 10 [place] all-purpose drainage catheter was advanced over the wire and formed. Aspiration yields 20 mL of thick purulent fluid. Samples were sent for Gram stain and culture. The catheter was flushed and connected to JP bulb suction. The catheter was secured to the skin with 0 Prolene suture. Follow-up CT imaging demonstrates a well-positioned drainage catheter and no evidence of immediate complication. The patient tolerated the procedure well. IMPRESSION: Successful placement of a 10 French percutaneous drain via a right transgluteal approach. Aspiration yielded 20 mL thick purulent fluid. Samples were sent for Gram stain and culture. PLAN: 1. Maintain drain to JP bulb suction. 2. Flush drainage catheter at least once per shift. 3. When drain output has cleared and remains scant (less than 20 mL) for at least 48 hours, this drain can be removed. This location is remote from the initial perforated appendicitis and drain injection is unlikely to be useful. Signed, Criselda Peaches, MD, [place] [facility] Radiology Electronically Signed   By: Jacqulynn Cadet M.D.   On: 08/03/2019 20:59   Marzetta Board, MD, PhD [facility]  Between 7 am - 7 pm I am available, please contact me via Amion or Securechat  Between 7 pm - 7 am I am not available, please  contact night coverage MD/APP via Amion

## 2019-08-04 NOTE — Progress Notes (Signed)
Subjective/Chief Complaint: Patient states that he feels well - ate a large dinner last night and had a BM this morning. Pain controlled with scheduled non-narcotic meds. Mobilizing. Denies fever, chills, nausea, vomiting.  Objective: Vital signs in last 24 hours: Temp:  [97.8 F (36.6 C)-98.7 F (37.1 C)] 97.8 F (36.6 C) (03/31 0730) Pulse Rate:  [77-92] 78 (03/31 0730) Resp:  [14-17] 17 (03/31 0730) BP: (120-137)/(74-94) 134/90 (03/31 0730) SpO2:  [97 %-100 %] 99 % (03/31 0730) Last BM Date: 08/01/19  Intake/Output from previous day: 03/30 0701 - 03/31 0700 In: 1495 [P.O.:480; I.V.:958; IV Piggyback:57] Out: 670 [Urine:650; Drains:20] Intake/Output this shift: No intake/output data recorded.  General appearance: cooperative Resp: clear to auscultation bilaterally Cardio: regular rate and rhythm GI: soft, mild distention, interval removal of RLQ drain, R transgluteal drain in place with SS drainage in bulb. (20 cc/24h)  Lab Results:  Recent Labs    08/03/19 0640 08/04/19 0515  WBC 26.9* 22.7*  HGB 12.6* 11.6*  HCT 37.5* 35.2*  PLT 620* 593*   BMET Recent Labs    08/03/19 0640 08/04/19 0515  NA 134* 134*  K 4.2 4.3  CL 94* 95*  CO2 27 27  GLUCOSE 99 168*  BUN 13 14  CREATININE 1.00 0.93  CALCIUM 8.9 8.9   Studies/Results: CT ABDOMEN PELVIS W CONTRAST  Result Date: 08/02/2019 CLINICAL DATA:  Elevated white blood cell count, history of perforated appendix with percutaneous drain placement. EXAM: CT ABDOMEN AND PELVIS WITH CONTRAST TECHNIQUE: Multidetector CT imaging of the abdomen and pelvis was performed using the standard protocol following bolus administration of intravenous contrast. CONTRAST:  OMNIPAQUE IOHEXOL 300 MG/ML  SOLN COMPARISON:  07/28/2019 FINDINGS: Lower chest: Minimal atelectasis right lower lobe. No acute pleural or parenchymal lung disease. Hepatobiliary: No focal liver abnormality is seen. No gallstones, gallbladder wall  thickening, or biliary dilatation. Pancreas: Unremarkable. No pancreatic ductal dilatation or surrounding inflammatory changes. Spleen: Normal in size without focal abnormality. Adrenals/Urinary Tract: Adrenal glands are unremarkable. Kidneys are normal, without renal calculi, focal lesion, or hydronephrosis. Bladder is unremarkable. Stomach/Bowel: There is diffuse small bowel distension with scattered gas fluid levels, though decreased in caliber since prior study. Findings most consistent with ileus. Percutaneous drain right lower quadrant unchanged in position. Dilated inflamed appendix is again seen adjacent to the percutaneous drainage catheter. There is a new rim enhancing fluid collection in the lower pelvis, measuring 5.3 x 3.8 cm reference image 78 of series 3, consistent with abscess. This does not communicate with the percutaneous drain right lower quadrant. Vascular/Lymphatic: Scattered atherosclerosis of the aorta unchanged. No pathologic adenopathy. Reproductive: Prostate is unremarkable. Other: There is trace free fluid throughout the abdomen and pelvis, greatest in the paracolic gutters. Central mesenteric edema most pronounced in the lower pelvis. Punctate foci of intraperitoneal gas are seen, compatible with percutaneous drain right lower quadrant. Musculoskeletal: No acute or destructive bony lesions. Reconstructed images demonstrate no additional findings. IMPRESSION: 1. New abscess within the lower central pelvis, measuring 5.3 x 3.8 cm. This does not communicate with the indwelling drainage catheter right lower quadrant. 2. Persistent changes of acute appendicitis, with percutaneous drainage catheter right lower quadrant. 3. Mesenteric edema with trace intra-abdominal ascites. 4. Continued ileus, though decreased caliber of the small bowel since prior study. These results will be called to the ordering clinician or representative by the Radiologist Assistant, and communication documented in the  PACS or [employer]. Electronically Signed   By: Maxwell Caul.D.  On: 08/02/2019 17:11   CT IMAGE GUIDED DRAINAGE BY PERCUTANEOUS CATHETER  Result Date: 08/03/2019 INDICATION: 49 year old male with a history of perforated appendicitis and intra-abdominal abscess formation. He had a CT-guided drainage catheter placed in the right lower quadrant by interventional radiology on 07/27/2019. Unfortunately, now he has a peripherally enhancing fluid collection in the pelvic cul-de-sac concerning for a new abscess. His prior abscess catheter was no longer draining and was subsequently removed. EXAM: CT-guided drain placement MEDICATIONS: The patient is currently admitted to the hospital and receiving intravenous antibiotics. The antibiotics were administered within an appropriate time frame prior to the initiation of the procedure. ANESTHESIA/SEDATION: Fentanyl 100 mcg IV; Versed 1 mg IV Moderate Sedation Time:  16 minutes The patient was continuously monitored during the procedure by the interventional radiology nurse under my direct supervision. COMPLICATIONS: None immediate. PROCEDURE: Informed written consent was obtained from the patient after a thorough discussion of the procedural risks, benefits and alternatives. All questions were addressed. Maximal Sterile Barrier Technique was utilized including caps, mask, sterile gowns, sterile gloves, sterile drape, hand hygiene and skin antiseptic. A timeout was performed prior to the initiation of the procedure. A planning axial CT scan was performed. The fluid collection in the pelvic cul-de-sac was localized. A suitable skin entry site was selected and marked. The overlying skin was sterilely prepped and draped in the standard fashion using chlorhexidine skin prep. Local anesthesia was attained by infiltration with 1% lidocaine. A small dermatotomy was made. Under intermittent CT guidance, an 18 gauge trocar needle was carefully advanced via a right  transgluteal parasacral approach into the fluid collection. A 0.035 wire was then coiled in the fluid collection. The trocar needle was removed. The skin tract was dilated to 10 [place]. A Cook 10 [place] all-purpose drainage catheter was advanced over the wire and formed. Aspiration yields 20 mL of thick purulent fluid. Samples were sent for Gram stain and culture. The catheter was flushed and connected to JP bulb suction. The catheter was secured to the skin with 0 Prolene suture. Follow-up CT imaging demonstrates a well-positioned drainage catheter and no evidence of immediate complication. The patient tolerated the procedure well. IMPRESSION: Successful placement of a 10 French percutaneous drain via a right transgluteal approach. Aspiration yielded 20 mL thick purulent fluid. Samples were sent for Gram stain and culture. PLAN: 1. Maintain drain to JP bulb suction. 2. Flush drainage catheter at least once per shift. 3. When drain output has cleared and remains scant (less than 20 mL) for at least 48 hours, this drain can be removed. This location is remote from the initial perforated appendicitis and drain injection is unlikely to be useful. Signed, Sterling Big, MD, [facility] Radiology Electronically Signed   By: Malachy Moan M.D.   On: 08/03/2019 20:59    Anti-infectives: Anti-infectives (From admission, onward)   Start     Dose/Rate Route Frequency Ordered Stop   07/27/19 1200  piperacillin-tazobactam (ZOSYN) IVPB 3.375 g     3.375 g 12.5 mL/hr over 240 Minutes Intravenous Every 8 hours 07/27/19 0403     07/27/19 0315  piperacillin-tazobactam (ZOSYN) IVPB 3.375 g     3.375 g 100 mL/hr over 30 Minutes Intravenous  Once 07/27/19 0305 07/27/19 0401      Assessment/Plan: Perforated appendicitis with abscess -ct 3/24 with ileus after drain placement, now clinically resolved, ng out, tol diet -CT 3/29 revealed a new 3.8x5 cm abscess  in the pelvis - s/p R  transgluteal drain 3/30, follow Cx, RLQ drain removed - WBC 22 from 26, vitals are all WNL - Zosyn - continue regular diet and IV zosyn, could likely transition to PO abx tomorrow and discharge home if pain remains controlled and having bowel function.  - Dr. Donne Hazel discussed possible interval appy and will see back in office once discharged  Acute kidney injury-resolved HTN -norvasc and metoprolol per TRH VTE - scds, resume lovenox FEN - NPO, IVF     LOS: 8 days    Jill Alexanders 08/04/2019

## 2019-08-04 NOTE — Progress Notes (Signed)
Referring Physician(s): Georganna Skeans   Supervising Physician: Aletta Edouard  Patient Status:  [facility] - In-pt  Chief Complaint:  Perforated appendicitis with abscess  Brief History:  Russell Robinsonis a 49 y.o.malewho presented to the ED withRLQabdominal pain X 3 days.  CT showed periappendiceal abscess.  He underwent drain placement 07/27/19 by Dr. Vernard Gambles.  WBC was trending up.   Repeat CT 08/02/19 showed new abscess within the lower central pelvis, measuring 5.3 x 3.8 Cm which did not communicate with the indwelling drainage catheter in the right lower quadrant.  He underwent transgluteal drain placement yesterday by Dr. Laurence Ferrari.  Subjective:  He is feeling better. NGT has been removed since the last time I rounded on him. He is tolerating a diet.  Allergies: Patient has no known allergies.  Medications: Prior to Admission medications   Not on File     Vital Signs: BP 134/90 (BP Location: Right Arm)   Pulse 78   Temp 97.8 F (36.6 C) (Oral)   Resp 17   Ht 6' 1.5" (1.867 m)   Wt 68.9 kg   SpO2 99%   BMI 19.78 kg/m   Physical Exam Constitutional:      Appearance: Normal appearance.  HENT:     Head: Normocephalic and atraumatic.  Cardiovascular:     Rate and Rhythm: Normal rate.  Pulmonary:     Effort: Pulmonary effort is normal. No respiratory distress.  Abdominal:     Palpations: Abdomen is soft.     Tenderness: There is no abdominal tenderness.     Comments: Right transgluteal drain in place. Clear yellow drainage in bulb. ~20 mL output recorded.  Skin:    General: Skin is warm and dry.  Neurological:     General: No focal deficit present.     Mental Status: He is alert and oriented to person, place, and time.  Psychiatric:        Mood and Affect: Mood normal.        Behavior: Behavior normal.        Thought Content: Thought content normal.        Judgment: Judgment normal.     Imaging: CT ABDOMEN PELVIS W  CONTRAST  Result Date: 08/02/2019 CLINICAL DATA:  Elevated white blood cell count, history of perforated appendix with percutaneous drain placement. EXAM: CT ABDOMEN AND PELVIS WITH CONTRAST TECHNIQUE: Multidetector CT imaging of the abdomen and pelvis was performed using the standard protocol following bolus administration of intravenous contrast. CONTRAST:  168mL OMNIPAQUE IOHEXOL 300 MG/ML  SOLN COMPARISON:  07/28/2019 FINDINGS: Lower chest: Minimal atelectasis right lower lobe. No acute pleural or parenchymal lung disease. Hepatobiliary: No focal liver abnormality is seen. No gallstones, gallbladder wall thickening, or biliary dilatation. Pancreas: Unremarkable. No pancreatic ductal dilatation or surrounding inflammatory changes. Spleen: Normal in size without focal abnormality. Adrenals/Urinary Tract: Adrenal glands are unremarkable. Kidneys are normal, without renal calculi, focal lesion, or hydronephrosis. Bladder is unremarkable. Stomach/Bowel: There is diffuse small bowel distension with scattered gas fluid levels, though decreased in caliber since prior study. Findings most consistent with ileus. Percutaneous drain right lower quadrant unchanged in position. Dilated inflamed appendix is again seen adjacent to the percutaneous drainage catheter. There is a new rim enhancing fluid collection in the lower pelvis, measuring 5.3 x 3.8 cm reference image 78 of series 3, consistent with abscess. This does not communicate with the percutaneous drain right lower quadrant. Vascular/Lymphatic: Scattered atherosclerosis of the aorta unchanged. No pathologic adenopathy. Reproductive: Prostate is unremarkable.  Other: There is trace free fluid throughout the abdomen and pelvis, greatest in the paracolic gutters. Central mesenteric edema most pronounced in the lower pelvis. Punctate foci of intraperitoneal gas are seen, compatible with percutaneous drain right lower quadrant. Musculoskeletal: No acute or destructive  bony lesions. Reconstructed images demonstrate no additional findings. IMPRESSION: 1. New abscess within the lower central pelvis, measuring 5.3 x 3.8 cm. This does not communicate with the indwelling drainage catheter right lower quadrant. 2. Persistent changes of acute appendicitis, with percutaneous drainage catheter right lower quadrant. 3. Mesenteric edema with trace intra-abdominal ascites. 4. Continued ileus, though decreased caliber of the small bowel since prior study. These results will be called to the ordering clinician or representative by the Radiologist Assistant, and communication documented in the PACS or [employer]. Electronically Signed   By: Sharlet Salina M.D.   On: 08/02/2019 17:11   CT IMAGE GUIDED DRAINAGE BY PERCUTANEOUS CATHETER  Result Date: 08/03/2019 INDICATION: 49 year old male with a history of perforated appendicitis and intra-abdominal abscess formation. He had a CT-guided drainage catheter placed in the right lower quadrant by interventional radiology on 07/27/2019. Unfortunately, now he has a peripherally enhancing fluid collection in the pelvic cul-de-sac concerning for a new abscess. His prior abscess catheter was no longer draining and was subsequently removed. EXAM: CT-guided drain placement MEDICATIONS: The patient is currently admitted to the hospital and receiving intravenous antibiotics. The antibiotics were administered within an appropriate time frame prior to the initiation of the procedure. ANESTHESIA/SEDATION: Fentanyl 100 mcg IV; Versed 1 mg IV Moderate Sedation Time:  16 minutes The patient was continuously monitored during the procedure by the interventional radiology nurse under my direct supervision. COMPLICATIONS: None immediate. PROCEDURE: Informed written consent was obtained from the patient after a thorough discussion of the procedural risks, benefits and alternatives. All questions were addressed. Maximal Sterile Barrier Technique was utilized  including caps, mask, sterile gowns, sterile gloves, sterile drape, hand hygiene and skin antiseptic. A timeout was performed prior to the initiation of the procedure. A planning axial CT scan was performed. The fluid collection in the pelvic cul-de-sac was localized. A suitable skin entry site was selected and marked. The overlying skin was sterilely prepped and draped in the standard fashion using chlorhexidine skin prep. Local anesthesia was attained by infiltration with 1% lidocaine. A small dermatotomy was made. Under intermittent CT guidance, an 18 gauge trocar needle was carefully advanced via a right transgluteal parasacral approach into the fluid collection. A 0.035 wire was then coiled in the fluid collection. The trocar needle was removed. The skin tract was dilated to 10 [place]. A Cook 10 [place] all-purpose drainage catheter was advanced over the wire and formed. Aspiration yields 20 mL of thick purulent fluid. Samples were sent for Gram stain and culture. The catheter was flushed and connected to JP bulb suction. The catheter was secured to the skin with 0 Prolene suture. Follow-up CT imaging demonstrates a well-positioned drainage catheter and no evidence of immediate complication. The patient tolerated the procedure well. IMPRESSION: Successful placement of a 10 French percutaneous drain via a right transgluteal approach. Aspiration yielded 20 mL thick purulent fluid. Samples were sent for Gram stain and culture. PLAN: 1. Maintain drain to JP bulb suction. 2. Flush drainage catheter at least once per shift. 3. When drain output has cleared and remains scant (less than 20 mL) for at least 48 hours, this drain can be removed. This location is remote from the initial perforated appendicitis and drain injection is  unlikely to be useful. Signed, Sterling Big, MD, RPVI [facility] Electronically Signed   By: Malachy Moan M.D.   On:  08/03/2019 20:59    Labs:  CBC: Recent Labs    08/01/19 0447 08/02/19 0537 08/03/19 0640 08/04/19 0515  WBC 15.6* 20.7* 26.9* 22.7*  HGB 12.6* 12.1* 12.6* 11.6*  HCT 37.5* 36.1* 37.5* 35.2*  PLT 475* 548* 620* 593*    COAGS: Recent Labs    07/27/19 0506  INR 1.3*    BMP: Recent Labs    08/01/19 0447 08/02/19 0537 08/03/19 0640 08/04/19 0515  NA 135 134* 134* 134*  K 3.8 4.0 4.2 4.3  CL 100 95* 94* 95*  CO2 24 28 27 27   GLUCOSE 118* 118* 99 168*  BUN 15 11 13 14   CALCIUM 8.8* 8.4* 8.9 8.9  CREATININE 0.94 0.93 1.00 0.93  GFRNONAA >60 >60 >60 >60  GFRAA >60 >60 >60 >60    LIVER FUNCTION TESTS: Recent Labs    08/01/19 0447 08/02/19 0537 08/03/19 0640 08/04/19 0515  BILITOT 0.8 0.6 1.1 0.8  AST 26 30 35 36  ALT 32 45* 58* 63*  ALKPHOS 66 65 91 103  PROT 6.1* 5.5* 6.2* 5.9*  ALBUMIN 2.5* 2.4* 2.6* 2.5*    Assessment and Plan:  Periappendiceal abscess.  S/P drain placement 07/27/19 by Dr. 08/06/19. - this has been removed.  WBC up = Repeat CT 08/02/19 showed new abscess within the lower central pelvis, measuring 5.3 x 3.8 Cm which did not communicate with the indwelling drainage catheter in the right lower quadrant.  S/P transgluteal drain placement yesterday by Dr. Deanne Coffer.  Continue routine drain care with flushes. Recommend repeat CT scan when output down to ~10 mL or less per day.  Electronically Signed: 08/04/19, PA-C 08/04/2019, 11:34 AM    I spent a total of 15 Minutes at the the patient's bedside AND on the patient's hospital floor or unit, greater than 50% of which was counseling/coordinating care for Drain follow up.

## 2019-08-04 NOTE — Plan of Care (Signed)

## 2019-08-05 ENCOUNTER — Other Ambulatory Visit: Payer: Self-pay | Admitting: General Surgery

## 2019-08-05 DIAGNOSIS — K3533 Acute appendicitis with perforation and localized peritonitis, with abscess: Secondary | ICD-10-CM

## 2019-08-05 LAB — CBC
HCT: 36.3 % — ABNORMAL LOW (ref 39.0–52.0)
Hemoglobin: 11.7 g/dL — ABNORMAL LOW (ref 13.0–17.0)
MCH: 30.3 pg (ref 26.0–34.0)
MCHC: 32.2 g/dL (ref 30.0–36.0)
MCV: 94 fL (ref 80.0–100.0)
Platelets: 649 10*3/uL — ABNORMAL HIGH (ref 150–400)
RBC: 3.86 MIL/uL — ABNORMAL LOW (ref 4.22–5.81)
RDW: 13.2 % (ref 11.5–15.5)
WBC: 19.7 10*3/uL — ABNORMAL HIGH (ref 4.0–10.5)
nRBC: 0 % (ref 0.0–0.2)

## 2019-08-05 LAB — COMPREHENSIVE METABOLIC PANEL
ALT: 62 U/L — ABNORMAL HIGH (ref 0–44)
AST: 30 U/L (ref 15–41)
Albumin: 2.8 g/dL — ABNORMAL LOW (ref 3.5–5.0)
Alkaline Phosphatase: 110 U/L (ref 38–126)
Anion gap: 12 (ref 5–15)
BUN: 17 mg/dL (ref 6–20)
CO2: 27 mmol/L (ref 22–32)
Calcium: 9 mg/dL (ref 8.9–10.3)
Chloride: 99 mmol/L (ref 98–111)
Creatinine, Ser: 1.05 mg/dL (ref 0.61–1.24)
GFR calc Af Amer: >60 mL/min (ref >60)
GFR calc non Af Amer: >60 mL/min (ref >60)
Glucose, Bld: 140 mg/dL — ABNORMAL HIGH (ref 70–99)
Potassium: 4.8 mmol/L (ref 3.5–5.1)
Sodium: 138 mmol/L (ref 135–145)
Total Bilirubin: 0.6 mg/dL (ref 0.3–1.2)
Total Protein: 6.6 g/dL (ref 6.5–8.1)

## 2019-08-05 MED ORDER — POLYETHYLENE GLYCOL 3350 17 G PO PACK: 17.0000 g | PACK | Freq: Every day | ORAL | 0 refills | Status: DC | PRN

## 2019-08-05 MED ORDER — METHOCARBAMOL 500 MG PO TABS: 500.0000 mg | ORAL_TABLET | Freq: Three times a day (TID) | ORAL | 0 refills | Status: DC | PRN

## 2019-08-05 MED ORDER — CEFDINIR 300 MG PO CAPS: 300.0000 mg | ORAL_CAPSULE | Freq: Two times a day (BID) | ORAL | 0 refills | Status: AC

## 2019-08-05 MED ORDER — METRONIDAZOLE 500 MG PO TABS: 500.0000 mg | ORAL_TABLET | Freq: Three times a day (TID) | ORAL | 0 refills | Status: AC

## 2019-08-05 MED ORDER — ACETAMINOPHEN 500 MG PO TABS: 500.0000 mg | ORAL_TABLET | Freq: Four times a day (QID) | ORAL | 0 refills | Status: DC | PRN

## 2019-08-05 MED ORDER — SODIUM CHLORIDE 0.9% FLUSH: 5.0000 mL | Freq: Every day | INTRAVENOUS | 1 refills | Status: AC

## 2019-08-05 MED FILL — METHOCARBAMOL 500 MG TABS: 500 | 10 days supply | Qty: 30 | Fill #0

## 2019-08-05 MED FILL — ACETAMINOPHEN 500MG XT STRE: 500 | 4 days supply | Qty: 30 | Fill #0

## 2019-08-05 MED FILL — AMLODIPINE BESYLATE 10 MG T: 10 | 30 days supply | Qty: 30 | Fill #0

## 2019-08-05 MED FILL — metroNIDAZOLE 500 MG TABS: 500 | 7 days supply | Qty: 21 | Fill #0

## 2019-08-05 MED FILL — CEFDINIR 300 MG CAPSULE: 300 | 7 days supply | Qty: 14 | Fill #0

## 2019-08-05 MED FILL — HYDROCHLOROTHIAZIDE 12.5 MG: 12.5 | 30 days supply | Qty: 30 | Fill #0

## 2019-08-05 MED FILL — METOPROLOL TARTRATE 25 MG T: 25 | 30 days supply | Qty: 60 | Fill #0

## 2019-08-05 NOTE — Progress Notes (Signed)
Referring Physician(s): Thompson,B  Supervising Physician: Ruel Favors  Patient Status:  [name] - In-pt  Chief Complaint: Perforated appendicitis/abscess   Subjective: Patient doing well this morning; plans noted for discharge home today with drain   Allergies: Patient has no known allergies.  Medications: Prior to Admission medications   Medication Sig Start Date End Date Taking? Authorizing Provider  acetaminophen (TYLENOL) 500 MG tablet Take 1-2 tablets (500-1,000 mg total) by mouth every 6 (six) hours as needed for mild pain. 08/05/19   Meuth, Brooke A, PA-C  amLODipine (NORVASC) 10 MG tablet Take 1 tablet (10 mg total) by mouth daily. 08/05/19   Leatha Gilding, MD  cefdinir (OMNICEF) 300 MG capsule Take 1 capsule (300 mg total) by mouth 2 (two) times daily for 7 days. 08/05/19 08/12/19  Meuth, Lina Sar, PA-C  hydrochlorothiazide (MICROZIDE) 12.5 MG capsule Take 1 capsule (12.5 mg total) by mouth daily. 08/05/19   Leatha Gilding, MD  methocarbamol (ROBAXIN) 500 MG tablet Take 1 tablet (500 mg total) by mouth every 8 (eight) hours as needed for muscle spasms. 08/05/19   Meuth, Brooke A, PA-C  metoprolol tartrate (LOPRESSOR) 25 MG tablet Take 1 tablet (25 mg total) by mouth 2 (two) times daily. 08/04/19   Leatha Gilding, MD  metroNIDAZOLE (FLAGYL) 500 MG tablet Take 1 tablet (500 mg total) by mouth 3 (three) times daily for 7 days. 08/05/19 08/12/19  Meuth, Brooke A, PA-C  polyethylene glycol (MIRALAX / GLYCOLAX) 17 g packet Take 17 g by mouth daily as needed for mild constipation. 08/05/19   Meuth, Brooke A, PA-C  sodium chloride flush (NS) 0.9 % SOLN 5 mLs by Intracatheter route daily for 28 days. Flush drain once daily with 93mL normal saline 08/05/19 09/02/19  Meuth, Brooke A, PA-C     Vital Signs: BP (!) 146/99 (BP Location: Right Arm)   Pulse 87   Temp 98 F (36.7 C) (Oral)   Resp 19   Ht 6' 1.5" (1.867 m)   Wt 152 lb (68.9 kg)   SpO2 99%   BMI 19.78 kg/m   Physical Exam  awake, alert.  Right transgluteal drain intact, insertion site okay, mild tenderness to palpation, about 20 cc output blood-tinged fluid recorded  Imaging: CT ABDOMEN PELVIS W CONTRAST  Result Date: 08/02/2019 CLINICAL DATA:  Elevated white blood cell count, history of perforated appendix with percutaneous drain placement. EXAM: CT ABDOMEN AND PELVIS WITH CONTRAST TECHNIQUE: Multidetector CT imaging of the abdomen and pelvis was performed using the standard protocol following bolus administration of intravenous contrast. CONTRAST:  OMNIPAQUE IOHEXOL 300 MG/ML  SOLN COMPARISON:  07/28/2019 FINDINGS: Lower chest: Minimal atelectasis right lower lobe. No acute pleural or parenchymal lung disease. Hepatobiliary: No focal liver abnormality is seen. No gallstones, gallbladder wall thickening, or biliary dilatation. Pancreas: Unremarkable. No pancreatic ductal dilatation or surrounding inflammatory changes. Spleen: Normal in size without focal abnormality. Adrenals/Urinary Tract: Adrenal glands are unremarkable. Kidneys are normal, without renal calculi, focal lesion, or hydronephrosis. Bladder is unremarkable. Stomach/Bowel: There is diffuse small bowel distension with scattered gas fluid levels, though decreased in caliber since prior study. Findings most consistent with ileus. Percutaneous drain right lower quadrant unchanged in position. Dilated inflamed appendix is again seen adjacent to the percutaneous drainage catheter. There is a new rim enhancing fluid collection in the lower pelvis, measuring 5.3 x 3.8 cm reference image 78 of series 3, consistent with abscess. This does not communicate with the percutaneous drain right lower quadrant.  Vascular/Lymphatic: Scattered atherosclerosis of the aorta unchanged. No pathologic adenopathy. Reproductive: Prostate is unremarkable. Other: There is trace free fluid throughout the abdomen and pelvis, greatest in the paracolic gutters. Central mesenteric edema most  pronounced in the lower pelvis. Punctate foci of intraperitoneal gas are seen, compatible with percutaneous drain right lower quadrant. Musculoskeletal: No acute or destructive bony lesions. Reconstructed images demonstrate no additional findings. IMPRESSION: 1. New abscess within the lower central pelvis, measuring 5.3 x 3.8 cm. This does not communicate with the indwelling drainage catheter right lower quadrant. 2. Persistent changes of acute appendicitis, with percutaneous drainage catheter right lower quadrant. 3. Mesenteric edema with trace intra-abdominal ascites. 4. Continued ileus, though decreased caliber of the small bowel since prior study. These results will be called to the ordering clinician or representative by the Radiologist Assistant, and communication documented in the PACS or [employer]. Electronically Signed   By: Sharlet Salina M.D.   On: 08/02/2019 17:11   CT IMAGE GUIDED DRAINAGE BY PERCUTANEOUS CATHETER  Result Date: 08/03/2019 INDICATION: 49 year old male with a history of perforated appendicitis and intra-abdominal abscess formation. He had a CT-guided drainage catheter placed in the right lower quadrant by interventional radiology on 07/27/2019. Unfortunately, now he has a peripherally enhancing fluid collection in the pelvic cul-de-sac concerning for a new abscess. His prior abscess catheter was no longer draining and was subsequently removed. EXAM: CT-guided drain placement MEDICATIONS: The patient is currently admitted to the hospital and receiving intravenous antibiotics. The antibiotics were administered within an appropriate time frame prior to the initiation of the procedure. ANESTHESIA/SEDATION: Fentanyl 100 mcg IV; Versed 1 mg IV Moderate Sedation Time:  16 minutes The patient was continuously monitored during the procedure by the interventional radiology nurse under my direct supervision. COMPLICATIONS: None immediate. PROCEDURE: Informed written consent was obtained  from the patient after a thorough discussion of the procedural risks, benefits and alternatives. All questions were addressed. Maximal Sterile Barrier Technique was utilized including caps, mask, sterile gowns, sterile gloves, sterile drape, hand hygiene and skin antiseptic. A timeout was performed prior to the initiation of the procedure. A planning axial CT scan was performed. The fluid collection in the pelvic cul-de-sac was localized. A suitable skin entry site was selected and marked. The overlying skin was sterilely prepped and draped in the standard fashion using chlorhexidine skin prep. Local anesthesia was attained by infiltration with 1% lidocaine. A small dermatotomy was made. Under intermittent CT guidance, an 18 gauge trocar needle was carefully advanced via a right transgluteal parasacral approach into the fluid collection. A 0.035 wire was then coiled in the fluid collection. The trocar needle was removed. The skin tract was dilated to 10 [place]. A Cook 10 [place] all-purpose drainage catheter was advanced over the wire and formed. Aspiration yields 20 mL of thick purulent fluid. Samples were sent for Gram stain and culture. The catheter was flushed and connected to JP bulb suction. The catheter was secured to the skin with 0 Prolene suture. Follow-up CT imaging demonstrates a well-positioned drainage catheter and no evidence of immediate complication. The patient tolerated the procedure well. IMPRESSION: Successful placement of a 10 French percutaneous drain via a right transgluteal approach. Aspiration yielded 20 mL thick purulent fluid. Samples were sent for Gram stain and culture. PLAN: 1. Maintain drain to JP bulb suction. 2. Flush drainage catheter at least once per shift. 3. When drain output has cleared and remains scant (less than 20 mL) for at least 48 hours, this drain can be  removed. This location is remote from the initial perforated appendicitis and drain injection is unlikely to be  useful. Signed, Criselda Peaches, MD, [place] [facility] Radiology Electronically Signed   By: Jacqulynn Cadet M.D.   On: 08/03/2019 20:59    Labs:  CBC: Recent Labs    08/02/19 0537 08/03/19 0640 08/04/19 0515 08/05/19 0136  WBC 20.7* 26.9* 22.7* 19.7*  HGB 12.1* 12.6* 11.6* 11.7*  HCT 36.1* 37.5* 35.2* 36.3*  PLT 548* 620* 593* 649*    COAGS: Recent Labs    07/27/19 0506  INR 1.3*    BMP: Recent Labs    08/02/19 0537 08/03/19 0640 08/04/19 0515 08/05/19 0136  NA 134* 134* 134* 138  K 4.0 4.2 4.3 4.8  CL 95* 94* 95* 99  CO2 28 27 27 27   GLUCOSE 118* 99 168* 140*  BUN 11 13 14 17   CALCIUM 8.4* 8.9 8.9 9.0  CREATININE 0.93 1.00 0.93 1.05  GFRNONAA >60 >60 >60 >60  GFRAA >60 >60 >60 >60    LIVER FUNCTION TESTS: Recent Labs    08/02/19 0537 08/03/19 0640 08/04/19 0515 08/05/19 0136  BILITOT 0.6 1.1 0.8 0.6  AST 30 35 36 30  ALT 45* 58* 63* 62*  ALKPHOS 65 91 103 110  PROT 5.5* 6.2* 5.9* 6.6  ALBUMIN 2.4* 2.6* 2.5* 2.8*    Assessment and Plan: Patient with history of perforated appendicitis and subsequent abscess formation; status post previously placed right lower quadrant drain on 07/27/2019; now with additional fluid collection in the pelvic cul-de-sac concerning for new abscess; status post removal of right lower quadrant drain and placement of new right transgluteal/pelvic drain on 08/03/2019; afebrile, WBC 19.7 down from 22.7, hemoglobin 11.7 and stable, creatinine normal, drain fluid cultures with rare gram-negative rods; [facility] plans to discharge patient home today with drain; recommend once daily irrigation of drain with 5 cc sterile normal saline, output recording and dressing changes every 1 to 2 days.  He will be scheduled for follow-up in IR drain clinic in 1 to 2 weeks.   Electronically Signed: D. Rowe Robert, PA-C 08/05/2019, 11:37 AM   I spent a total of 15 minutes at the the patient's bedside  AND on the patient's hospital floor or unit, greater than 50% of which was counseling/coordinating care for right pelvic abscess drain    Patient ID: Russell Wells, male   DOB: [birth year], 49 y.o.   MRN: [id number]

## 2019-08-05 NOTE — Discharge Summary (Signed)
[facility] Discharge Summary   Patient ID: Shawon Denzer MRN: [id number] DOB/AGE: February 15, 1971 49 y.o.  Admit date: 07/26/2019 Discharge date: 08/05/2019  Admitting Diagnosis: Perforated appendicitis  Discharge Diagnosis Patient Active Problem List   Diagnosis Date Noted  . Essential hypertension 07/30/2019  . Perforated appendicitis 07/27/2019    Consultants Interventional radiology Internal medicine  Imaging: CT IMAGE GUIDED DRAINAGE BY PERCUTANEOUS CATHETER  Result Date: 08/03/2019 INDICATION: 49 year old male with a history of perforated appendicitis and intra-abdominal abscess formation. He had a CT-guided drainage catheter placed in the right lower quadrant by interventional radiology on 07/27/2019. Unfortunately, now he has a peripherally enhancing fluid collection in the pelvic cul-de-sac concerning for a new abscess. His prior abscess catheter was no longer draining and was subsequently removed. EXAM: CT-guided drain placement MEDICATIONS: The patient is currently admitted to the hospital and receiving intravenous antibiotics. The antibiotics were administered within an appropriate time frame prior to the initiation of the procedure. ANESTHESIA/SEDATION: Fentanyl 100 mcg IV; Versed 1 mg IV Moderate Sedation Time:  16 minutes The patient was continuously monitored during the procedure by the interventional radiology nurse under my direct supervision. COMPLICATIONS: None immediate. PROCEDURE: Informed written consent was obtained from the patient after a thorough discussion of the procedural risks, benefits and alternatives. All questions were addressed. Maximal Sterile Barrier Technique was utilized including caps, mask, sterile gowns, sterile gloves, sterile drape, hand hygiene and skin antiseptic. A timeout was performed prior to the initiation of the procedure. A planning axial CT scan was performed. The fluid collection in the pelvic cul-de-sac was localized. A  suitable skin entry site was selected and marked. The overlying skin was sterilely prepped and draped in the standard fashion using chlorhexidine skin prep. Local anesthesia was attained by infiltration with 1% lidocaine. A small dermatotomy was made. Under intermittent CT guidance, an 18 gauge trocar needle was carefully advanced via a right transgluteal parasacral approach into the fluid collection. A 0.035 wire was then coiled in the fluid collection. The trocar needle was removed. The skin tract was dilated to 10 [place]. A Cook 10 [place] all-purpose drainage catheter was advanced over the wire and formed. Aspiration yields 20 mL of thick purulent fluid. Samples were sent for Gram stain and culture. The catheter was flushed and connected to JP bulb suction. The catheter was secured to the skin with 0 Prolene suture. Follow-up CT imaging demonstrates a well-positioned drainage catheter and no evidence of immediate complication. The patient tolerated the procedure well. IMPRESSION: Successful placement of a 10 French percutaneous drain via a right transgluteal approach. Aspiration yielded 20 mL thick purulent fluid. Samples were sent for Gram stain and culture. PLAN: 1. Maintain drain to JP bulb suction. 2. Flush drainage catheter at least once per shift. 3. When drain output has cleared and remains scant (less than 20 mL) for at least 48 hours, this drain can be removed. This location is remote from the initial perforated appendicitis and drain injection is unlikely to be useful. Signed, Sterling Big, MD, RPVI [facility] Radiology Electronically Signed   By: Malachy Moan M.D.   On: 08/03/2019 20:59    Procedures Dr. Deanne Coffer (07/27/2019) - CT RLQ abscess drain catheter placement 55f Dr. Archer Asa (08/03/2019) - Placement of a right transgluteal 23F drain   Hospital Course:  Osha Errico is a 49yo male who presented to [facility] 3/23 with 3 days of  RLQ abdominal pain.  Workup included CT scan which showed perforated appendicitis with  abscess. Patient was admitted to the surgical service and started on IV antibiotics. Interventional radiology was consulted and placed a percutaneous drain on 3/23. Patient clinically improved but had a worsening leukocytosis, therefore CT scan was repeated 3/29 and revealed a new 3.8x5cm abscess in the pelvis. IR was again consulted and placed a new percutaneous drain; initial drain was removed. Leukocytosis improved.  Internal medicine was consulted 3/26 for hypertension. He was ultimately started on 3 agents and plans outpatient follow up with primary care physician.  On 4/1 the patient was felt to be medically stable for discharge home. He will go home with the drain and 7 days of cefdinir and flagyl. Patient will follow up as below and knows to call with questions or concerns.    Physical Exam: Gen: Alert, NAD Resp: CTAB, rate and effort normal Cardio: regular rate and rhythm GI: soft, nondistended, +BS, nontender, R transgluteal drain in place with cloudy/SS drainage in bulb. (20 cc/24h)  Allergies as of 08/05/2019   No Known Allergies     Medication List    TAKE these medications   acetaminophen 500 MG tablet Commonly known as: TYLENOL Take 1-2 tablets (500-1,000 mg total) by mouth every 6 (six) hours as needed for mild pain.   amLODipine 10 MG tablet Commonly known as: NORVASC Take 1 tablet (10 mg total) by mouth daily.   cefdinir 300 MG capsule Commonly known as: OMNICEF Take 1 capsule (300 mg total) by mouth 2 (two) times daily for 7 days.   hydrochlorothiazide 12.5 MG capsule Commonly known as: MICROZIDE Take 1 capsule (12.5 mg total) by mouth daily.   methocarbamol 500 MG tablet Commonly known as: ROBAXIN Take 1 tablet (500 mg total) by mouth every 8 (eight) hours as needed for muscle spasms.   metoprolol tartrate 25 MG tablet Commonly known as: LOPRESSOR Take 1 tablet (25 mg total)  by mouth 2 (two) times daily.   metroNIDAZOLE 500 MG tablet Commonly known as: Flagyl Take 1 tablet (500 mg total) by mouth 3 (three) times daily for 7 days.   polyethylene glycol 17 g packet Commonly known as: MIRALAX / GLYCOLAX Take 17 g by mouth daily as needed for mild constipation.   sodium chloride flush 0.9 % Soln Commonly known as: NS 5 mLs by Intracatheter route daily for 28 days. Flush drain once daily with 93mL normal saline        Follow-up Information    Rolm Bookbinder, MD. Go on 08/16/2019.   Specialty: General Surgery Why: 4/12 at 4:30  You will need to arrive 30 min early for paperwork. Please bring a copy of your photo ID and insurance card.  Contact information: [address] [place] [address] [phone]        [facility] Follow up.   Why: Please call [facility] at [phone] to schedule a primary health care provider to followup after discharge from the hospital. Contact information: Please call [facility] at [phone]       Arne Cleveland, MD Follow up in 2 week(s).   Specialties: Interventional Radiology, Radiology Why: please flush drain daily- record output; IR will call pt with time and date of follow up with Dr Vernard Gambles-- call [phone] if questions Contact information: [address] [place] [address] [phone]           Signed: Wellington Hampshire, [facility] Surgery 08/05/2019, 10:53 AM Please see Amion for pager number during day hours 7:00am-4:30pm

## 2019-08-05 NOTE — Progress Notes (Signed)
Patient discharging home. Discharge instructions explained to patient including flushing drain and dressing changes, patient verbalized understanding. Extra supplies given to patient. Packed all personal belongings. No further questions or concerns voiced.

## 2019-08-05 NOTE — Progress Notes (Signed)
PROGRESS NOTE  Russell Wells YWV:[id number] DOB: November 28, 1970 DOA: 07/26/2019 PCP: Patient, No Pcp Per   LOS: 9 days   Brief Narrative / Interim history: Russell Robinsonis an 49 y.o.malewithout known PMH who presented on 3/22 with perforated appendicitis.He reports that he works overnight at [employer] and he developed sharp pain in his abdomen. He drank a cold drink but the sharp pain in his RLQ was still ongoing. He finished working, went home, and took Advil and tried to rest. He slept all day and it was a little better than night when he got up. About 3AM "all hell broke loose."  He had physicals every year and was told to watch his BP. He has never been on medication. No h/o chronic medical problems.  Subjective / 24h Interval events: Feeling well, tolerating food without difficulties  Assessment & Plan: Principal Problem Essential hypertension -Has a history of slightly elevated blood pressure and has been told that an outpatient to monitor his BP for several years.  Currently started on amlodipine 10 mg, HCTZ 12.5 as well as metoprolol 25 twice daily. BP still on the high side this morning however BP tends to run a bit higher in hospitalized patients. I have written scripts for 2 months of BP meds. Patient is in process of establishing care with outpatient primary care. Discussed about low sodium diet and BP monitoring at home. OK to go home if OK with surgical service.   Active Problems Perforated appendicitis/intra-abdominal abscess -He is admitted on surgical service and had a pigtail drainage catheter in the right lower quadrant which has been removed -due to worsening leukocytosis underwent a CT scan on 3/29 which revealed a new 3.8 x 5 cm abscess in the pelvis, status post right transgluteal drain on 3/30 -WBC improving -Cultures initially obtained on 3/23 showed Klebsiella, Streptococcus Constellatus, bacteroides caccae.  Currently on Zosyn.  It is sensitive to  Augmentin and if WBC is improving and he remains afebrile per general surgery notes he may be discharged home today  Acute kidney injury -Prerenal, resolved with fluids   Scheduled Meds: . acetaminophen  1,000 mg Oral Q6H  . amLODipine  10 mg Oral Daily  . enoxaparin (LOVENOX) injection  40 mg Subcutaneous Q24H  . hydrochlorothiazide  12.5 mg Oral Daily  . methocarbamol  500 mg Oral TID  . metoprolol tartrate  25 mg Oral BID  . polyethylene glycol  17 g Oral Daily  . sodium chloride flush  5 mL Intracatheter Q8H   Continuous Infusions: . piperacillin-tazobactam (ZOSYN)  IV 3.375 g (08/05/19 0324)   PRN Meds:.diphenhydrAMINE **OR** diphenhydrAMINE, HYDROmorphone (DILAUDID) injection, metoprolol tartrate, ondansetron **OR** ondansetron (ZOFRAN) IV, oxyCODONE, simethicone  DVT prophylaxis: Lovenox Code Status: Full code Family Communication: d/w patient  Patient admitted from: home Anticipated d/c place: home Barriers to d/c: dc per primary team.   Procedures:  Right lower quadrant abscess drain 3/23 by IR (now removed), pelvic drain 3/30 by IR  Antimicrobials: On Zosyn    Objective: Vitals:   08/04/19 0730 08/04/19 1429 08/04/19 1956 08/05/19 0343  BP: 134/90 136/89 (!) 144/91 (!) 164/98  Pulse: 78 83 86 86  Resp: 17 17 18    Temp: 97.8 F (36.6 C) 98 F (36.7 C) 98.2 F (36.8 C) 97.6 F (36.4 C)  TempSrc: Oral Oral Oral Oral  SpO2: 99% 96% 98% 100%  Weight:      Height:        Intake/Output Summary (Last 24 hours) at 08/05/2019 0744 Last data  filed at 08/05/2019 0646 Gross per 24 hour  Intake --  Output 195 ml  Net -195 ml   Filed Weights   07/26/19 1709  Weight: 68.9 kg    Examination:  Constitutional: NAD Eyes: no icterus  ENMT: mmm Neck: normal, supple Respiratory: CTa biL, no wheezing, no crackles  Cardiovascular: RRR, no MRG, no edema Abdomen: soft, NT, ND, BS+ Musculoskeletal: no clubbing / cyanosis.  Skin: no new rashes Neurologic: no focal  deficits  Data Reviewed: I have independently reviewed following labs and imaging studies   CBC: Recent Labs  Lab 08/01/19 0447 08/02/19 0537 08/03/19 0640 08/04/19 0515 08/05/19 0136  WBC 15.6* 20.7* 26.9* 22.7* 19.7*  NEUTROABS  --  14.4* 21.8* 17.5*  --   HGB 12.6* 12.1* 12.6* 11.6* 11.7*  HCT 37.5* 36.1* 37.5* 35.2* 36.3*  MCV 91.5 91.2 90.4 92.4 94.0  PLT 475* 548* 620* 593* 546*   Basic Metabolic Panel: Recent Labs  Lab 08/01/19 0447 08/02/19 0537 08/03/19 0640 08/04/19 0515 08/05/19 0136  NA 135 134* 134* 134* 138  K 3.8 4.0 4.2 4.3 4.8  CL 100 95* 94* 95* 99  CO2 24 28 27 27 27   GLUCOSE 118* 118* 99 168* 140*  BUN 15 11 13 14 17   CREATININE 0.94 0.93 1.00 0.93 1.05  CALCIUM 8.8* 8.4* 8.9 8.9 9.0  MG 1.5* 1.6* 2.0 1.7  --   PHOS 2.6 3.2 3.7 2.7  --    Liver Function Tests: Recent Labs  Lab 08/01/19 0447 08/02/19 0537 08/03/19 0640 08/04/19 0515 08/05/19 0136  AST 26 30 35 36 30  ALT 32 45* 58* 63* 62*  ALKPHOS 66 65 91 103 110  BILITOT 0.8 0.6 1.1 0.8 0.6  PROT 6.1* 5.5* 6.2* 5.9* 6.6  ALBUMIN 2.5* 2.4* 2.6* 2.5* 2.8*   Coagulation Profile: No results for input(s): INR, PROTIME in the last 168 hours. HbA1C: No results for input(s): HGBA1C in the last 72 hours. CBG: No results for input(s): GLUCAP in the last 168 hours.  Recent Results (from the past 240 hour(s))  Respiratory Panel by RT PCR (Flu A&B, Covid) - Nasopharyngeal Swab     Status: None   Collection Time: 07/27/19  3:06 AM   Specimen: Nasopharyngeal Swab  Result Value Ref Range Status   SARS Coronavirus 2 by RT PCR NEGATIVE NEGATIVE Final    Comment: (NOTE) SARS-CoV-2 target nucleic acids are NOT DETECTED. The SARS-CoV-2 RNA is generally detectable in upper respiratoy specimens during the acute phase of infection. The lowest concentration of SARS-CoV-2 viral copies this assay can detect is 131 copies/mL. A negative result does not preclude SARS-Cov-2 infection and should not be  used as the sole basis for treatment or other patient management decisions. A negative result may occur with  improper specimen collection/handling, submission of specimen other than nasopharyngeal swab, presence of viral mutation(s) within the areas targeted by this assay, and inadequate number of viral copies (<131 copies/mL). A negative result must be combined with clinical observations, patient history, and epidemiological information. The expected result is Negative. Fact Sheet for Patients:  PinkCheek.be Fact Sheet for Healthcare Providers:  GravelBags.it This test is not yet ap proved or cleared by the [place] FDA and  has been authorized for detection and/or diagnosis of SARS-CoV-2 by FDA under an Emergency Use Authorization (EUA). This EUA will remain  in effect (meaning this test can be used) for the duration of the COVID-19 declaration under Section 564(b)(1) of the Act, 21 U.S.C. section  360bbb-3(b)(1), unless the authorization is terminated or revoked sooner.    Influenza A by PCR NEGATIVE NEGATIVE Final   Influenza B by PCR NEGATIVE NEGATIVE Final    Comment: (NOTE) The Xpert Xpress SARS-CoV-2/FLU/RSV assay is intended as an aid in  the diagnosis of influenza from Nasopharyngeal swab specimens and  should not be used as a sole basis for treatment. Nasal washings and  aspirates are unacceptable for Xpert Xpress SARS-CoV-2/FLU/RSV  testing. Fact Sheet for Patients: https://www.moore.com/ Fact Sheet for Healthcare Providers: https://www.young.biz/ This test is not yet approved or cleared by the [place] FDA and  has been authorized for detection and/or diagnosis of SARS-CoV-2 by  FDA under an Emergency Use Authorization (EUA). This EUA will remain  in effect (meaning this test can be used) for the duration of the  Covid-19 declaration under Section 564(b)(1) of the  Act, 21  U.S.C. section 360bbb-3(b)(1), unless the authorization is  terminated or revoked. Performed at [facility], [address], [place], [place] [address]   Aerobic/Anaerobic Culture (surgical/deep wound)     Status: None   Collection Time: 07/27/19 10:55 AM   Specimen: Abdomen; Abscess  Result Value Ref Range Status   Specimen Description ABDOMEN SYRINGE  Final   Special Requests RIGHT LOWER QUADRANT  Final   Gram Stain   Final    ABUNDANT WBC PRESENT, PREDOMINANTLY PMN ABUNDANT GRAM NEGATIVE RODS ABUNDANT GRAM POSITIVE COCCI ABUNDANT GRAM POSITIVE RODS    Culture   Final    FEW KLEBSIELLA PNEUMONIAE ABUNDANT STREPTOCOCCUS CONSTELLATUS ABUNDANT BACTEROIDES CACCAE BETA LACTAMASE POSITIVE Performed at [facility], [address], [place], [place] [address]    Report Status 07/30/2019 FINAL  Final   Organism ID, Bacteria KLEBSIELLA PNEUMONIAE  Final   Organism ID, Bacteria STREPTOCOCCUS CONSTELLATUS  Final      Susceptibility   Streptococcus constellatus - MIC*    PENICILLIN 0.25 INTERMEDIATE Intermediate     CEFTRIAXONE 1 SENSITIVE Sensitive     ERYTHROMYCIN <=0.12 SENSITIVE Sensitive     LEVOFLOXACIN <=0.25 SENSITIVE Sensitive     VANCOMYCIN 0.5 SENSITIVE Sensitive     * ABUNDANT STREPTOCOCCUS CONSTELLATUS   Klebsiella pneumoniae - MIC*    AMPICILLIN RESISTANT Resistant     CEFAZOLIN <=4 SENSITIVE Sensitive     CEFEPIME <=0.12 SENSITIVE Sensitive     CEFTAZIDIME <=1 SENSITIVE Sensitive     CEFTRIAXONE <=0.25 SENSITIVE Sensitive     CIPROFLOXACIN <=0.25 SENSITIVE Sensitive     GENTAMICIN <=1 SENSITIVE Sensitive     IMIPENEM <=0.25 SENSITIVE Sensitive     TRIMETH/SULFA <=20 SENSITIVE Sensitive     AMPICILLIN/SULBACTAM 4 SENSITIVE Sensitive     PIP/TAZO <=4 SENSITIVE Sensitive     * FEW KLEBSIELLA PNEUMONIAE  Aerobic/Anaerobic Culture (surgical/deep wound)     Status: None (Preliminary result)   Collection Time: 08/03/19  3:00 PM   Specimen: Abscess   Result Value Ref Range Status   Specimen Description ABSCESS  Final   Special Requests RLQ DRAINAGE  Final   Gram Stain   Final    ABUNDANT WBC PRESENT, PREDOMINANTLY PMN RARE GRAM VARIABLE ROD    Culture   Final    CULTURE REINCUBATED FOR BETTER GROWTH Performed at [facility], [address], [place], [place] [address]    Report Status PENDING  Incomplete     Radiology Studies: No results found. Pamella Pert, MD, PhD [facility]  Between 7 am - 7 pm I am available, please contact  me via Amion or Securechat  Between 7 pm - 7 am I am not available, please contact night coverage MD/APP via Amion

## 2019-08-05 NOTE — Discharge Instructions (Signed)
Percutaneous Abscess Drain, Care After This sheet gives you information about how to care for yourself after your procedure. Your health care provider may also give you more specific instructions. If you have problems or questions, contact your health care provider. What can I expect after the procedure? After your procedure, it is common to have:  A small amount of bruising and discomfort in the area where the drainage tube (catheter) was placed.  Sleepiness and fatigue. This should go away after the medicines you were given have worn off. Follow these instructions at home: Incision care  Follow instructions from your health care provider about how to take care of your incision. Make sure you: ? Wash your hands with soap and water before you change your bandage (dressing). If soap and water are not available, use hand sanitizer. ? Change your dressing as told by your health care provider. ? Leave stitches (sutures), skin glue, or adhesive strips in place. These skin closures may need to stay in place for 2 weeks or longer. If adhesive strip edges start to loosen and curl up, you may trim the loose edges. Do not remove adhesive strips completely unless your health care provider tells you to do that.  Check your incision area every day for signs of infection. Check for: ? More redness, swelling, or pain. ? More fluid or blood. ? Warmth. ? Pus or a bad smell. ? Fluid leaking from around your catheter (instead of fluid draining through your catheter). Catheter care   Follow instructions from your health care provider about emptying and cleaning your catheter and collection bag. You may need to clean the catheter every day so it does not clog.  If directed, write down the following information every time you empty your bag: ? The date and time. ? The amount of drainage. General instructions  Rest at home for 1-2 days after your procedure. Return to your normal activities as told by your  health care provider.  Do not take baths, swim, or use a hot tub for 24 hours after your procedure, or until your health care provider says that this is okay.  Take over-the-counter and prescription medicines only as told by your health care provider.  Keep all follow-up visits as told by your health care provider. This is important. Contact a health care provider if:  You have less than 10 mL of drainage a day for 2-3 days in a row, or as directed by your health care provider.  You have more redness, swelling, or pain around your incision area.  You have more fluid or blood coming from your incision area.  Your incision area feels warm to the touch.  You have pus or a bad smell coming from your incision area.  You have fluid leaking from around your catheter (instead of through your catheter).  You have a fever or chills.  You have pain that does not get better with medicine. Get help right away if:  Your catheter comes out.  You suddenly stop having drainage from your catheter.  You suddenly have blood in the fluid that is draining from your catheter.  You become dizzy or you faint.  You develop a rash.  You have nausea or vomiting.  You have difficulty breathing or you feel short of breath.  You develop chest pain.  You have problems with your speech or vision.  You have trouble balancing or moving your arms or legs. Summary  It is common to have a small   amount of bruising and discomfort in the area where the drainage tube (catheter) was placed.  You may be directed to record the amount of drainage from the bag every time you empty it.  Follow instructions from your health care provider about emptying and cleaning your catheter and collection bag. This information is not intended to replace advice given to you by your health care provider. Make sure you discuss any questions you have with your health care provider. Document Revised: 04/04/2017 Document  Reviewed: 03/14/2016 Elsevier Patient Education  2020 [employer]    Appendicitis, Adult  Appendicitis is inflammation of the appendix. The appendix is a finger-shaped tube that is attached to the large intestine. If appendicitis is not treated, it can cause the appendix to tear (rupture). A ruptured appendix can lead to a life-threatening infection. It can also cause a painful collection of pus (abscess) to form in the appendix. What are the causes? This condition may be caused by a blockage in the appendix that leads to infection. The blockage can be caused by:  A ball of stool (feces).  Enlarged lymph glands. In some cases, the cause may not be known. What increases the risk? Age is a risk factor. You are more likely to develop this condition if you are between 37 and 83 years of age. What are the signs or symptoms? Symptoms of this condition include:  Pain that starts around the belly button and moves toward the lower right part of the abdomen. The pain can become more severe as time passes. It gets worse with coughing or sudden movements.  Tenderness in the lower right abdomen.  Nausea.  Vomiting.  Loss of appetite.  Fever.  Difficulty passing stool (constipation).  Passing very loose stools (diarrhea).  Generally feeling unwell. How is this diagnosed? This condition may be diagnosed with:  A physical exam.  Blood tests.  Urine test. To confirm the diagnosis, an ultrasound, MRI, or CT scan may be done. How is this treated? This condition is usually treated with surgery to remove the appendix (appendectomy). There are two methods for doing an appendectomy:  Open appendectomy. In this surgery, the appendix is removed through a large incision that is made in the lower right abdomen. This procedure may be recommended if: ? You have major scarring from a previous surgery. ? You have a bleeding disorder. ? You are pregnant and are about to give birth. ? You have  a condition that makes it hard to do surgery through small incisions (laparoscopic procedure). This includes severe infection or a ruptured appendix.  Laparoscopic appendectomy. In this surgery, the appendix is removed through small incisions. This procedure usually causes less pain and fewer problems than an open appendectomy. It also has a shorter recovery time. If the appendix has ruptured and an abscess has formed:  A drain may be placed into the abscess to remove fluid.  Antibiotic medicines may be given through an IV.  The appendix may or may not need to be removed. Follow these instructions at home: If you had surgery, follow instructions from your health care provider about how to care for yourself at home and how to care for your incision. Medicines  Take over-the-counter and prescription medicines only as told by your health care provider.  If you were prescribed an antibiotic medicine, take it as told by your health care provider. Do not stop taking the antibiotic even if you start to feel better. Eating and drinking  Follow instructions from your  health care provider about eating restrictions. You may slowly resume a regular diet once your nausea or vomiting stops. General instructions  Do not use any products that contain nicotine or tobacco, such as cigarettes, e-cigarettes, and chewing tobacco. If you need help quitting, ask your health care provider.  Do not drive or use heavy machinery while taking prescription pain medicine.  Ask your health care provider if the medicine prescribed to you can cause constipation. You may need to take steps to prevent or treat constipation, such as: ? Drink enough fluid to keep your urine pale yellow. ? Take over-the-counter or prescription medicines. ? Eat foods that are high in fiber, such as beans, whole grains, and fresh fruits and vegetables. ? Limit foods that are high in fat and processed sugars, such as fried or sweet  foods.  Keep all follow-up visits as told by your health care provider. This is important. Contact a health care provider if:  There is pus, blood, or excessive drainage coming from your incision.  You have nausea or vomiting. Get help right away if you have:  Worsening abdominal pain.  A fever.  Chills.  Fatigue.  Muscle aches.  Shortness of breath. Summary  Appendicitis is inflammation of the appendix.  This condition may be caused by a blockage in the appendix that leads to infection.  This condition is usually treated with surgery to remove the appendix. This information is not intended to replace advice given to you by your health care provider. Make sure you discuss any questions you have with your health care provider. Document Revised: 10/08/2017 Document Reviewed: 10/08/2017 Elsevier Patient Education  [place].

## 2019-08-08 LAB — AEROBIC/ANAEROBIC CULTURE W GRAM STAIN (SURGICAL/DEEP WOUND)

## 2019-08-13 ENCOUNTER — Ambulatory Visit: Payer: No Typology Code available for payment source | Admitting: Family Medicine

## 2019-08-16 ENCOUNTER — Other Ambulatory Visit: Payer: Self-pay

## 2019-08-17 ENCOUNTER — Encounter: Payer: Self-pay | Admitting: Family Medicine

## 2019-08-17 ENCOUNTER — Ambulatory Visit (INDEPENDENT_AMBULATORY_CARE_PROVIDER_SITE_OTHER): Payer: No Typology Code available for payment source | Admitting: Family Medicine

## 2019-08-17 VITALS — BP 122/74 | HR 92 | Temp 97.5°F | Ht 71.0 in | Wt 150.2 lb

## 2019-08-17 DIAGNOSIS — Z09 Encounter for follow-up examination after completed treatment for conditions other than malignant neoplasm: Secondary | ICD-10-CM | POA: Diagnosis not present

## 2019-08-17 DIAGNOSIS — I10 Essential (primary) hypertension: Secondary | ICD-10-CM

## 2019-08-17 LAB — COMPREHENSIVE METABOLIC PANEL
ALT: 19 U/L (ref 0–53)
AST: 12 U/L (ref 0–37)
Albumin: 4 g/dL (ref 3.5–5.2)
Alkaline Phosphatase: 85 U/L (ref 39–117)
BUN: 27 mg/dL — ABNORMAL HIGH (ref 6–23)
CO2: 29 mEq/L (ref 19–32)
Calcium: 9.3 mg/dL (ref 8.4–10.5)
Chloride: 101 mEq/L (ref 96–112)
Creatinine, Ser: 0.86 mg/dL (ref 0.40–1.50)
GFR: 114.07 mL/min (ref >60.00)
Glucose, Bld: 95 mg/dL (ref 70–99)
Potassium: 4.2 mEq/L (ref 3.5–5.1)
Sodium: 139 mEq/L (ref 135–145)
Total Bilirubin: 0.4 mg/dL (ref 0.2–1.2)
Total Protein: 7 g/dL (ref 6.0–8.3)

## 2019-08-17 LAB — CBC
HCT: 29.5 % — ABNORMAL LOW (ref 39.0–52.0)
Hemoglobin: 9.8 g/dL — ABNORMAL LOW (ref 13.0–17.0)
MCHC: 33.2 g/dL (ref 30.0–36.0)
MCV: 93.5 fl (ref 78.0–100.0)
Platelets: 543 10*3/uL — ABNORMAL HIGH (ref 150.0–400.0)
RBC: 3.16 Mil/uL — ABNORMAL LOW (ref 4.22–5.81)
RDW: 13.6 % (ref 11.5–15.5)
WBC: 13.5 10*3/uL — ABNORMAL HIGH (ref 4.0–10.5)

## 2019-08-17 MED ORDER — AMLODIPINE BESYLATE 10 MG PO TABS: 10.0000 mg | ORAL_TABLET | Freq: Every day | ORAL | 1 refills | Status: DC

## 2019-08-17 MED ORDER — METOPROLOL TARTRATE 25 MG PO TABS: 25.0000 mg | ORAL_TABLET | Freq: Two times a day (BID) | ORAL | 1 refills | Status: DC

## 2019-08-17 MED ORDER — HYDROCHLOROTHIAZIDE 12.5 MG PO CAPS: 12.5000 mg | ORAL_CAPSULE | Freq: Every day | ORAL | 1 refills | Status: DC

## 2019-08-17 NOTE — Progress Notes (Signed)
New Patient Office Visit  Subjective:  Patient ID: Russell Wells, male    DOB: [birth year]  Age: 49 y.o. MRN: [id number]  CC:  Chief Complaint  Patient presents with  . Establish Care    New patient, hospital follow up on appendicitis    HPI Russell Wells presents for establishment of care and hospital discharge follow-up status post emergent appendectomy back on the sixth of this month for perforated appendix.  On admission patient was found to have hypertension.  He was discharged on Norvasc 10 mg, HCTZ 12.5 mg and metoprolol tartrate 25 mg twice daily.  Strong family history of hypertension.  Personal history of hypertension but has been lost to follow-up.  Has moved into this area to be close to family 1 year ago from [place].  He does not smoke or use illicit drugs.  He rarely drinks alcohol.  Currently living with his sister.  He works as a Water quality scientist at [employer].  He has a drain in place.  Tells me that he flushes it daily.  There is no longer any abdominal pain.  Drainage from his wound is decreasing daily.  Follow-up CT scan on Thursday.  Follow-up with surgery next week.  Past Medical History:  Diagnosis Date  . Essential hypertension     History reviewed. No pertinent surgical history.  Family History  Problem Relation Age of Onset  . Hypertension Mother   . Hypertension Father     Social History   Socioeconomic History  . Marital status: Not on file    Spouse name: Not on file  . Number of children: Not on file  . Years of education: Not on file  . Highest education level: Not on file  Occupational History  . Occupation: works in a Oceanographer  . Smoking status: Never Smoker  . Smokeless tobacco: Never Used  Substance and Sexual Activity  . Alcohol use: Yes    Comment: rarely  . Drug use: Not Currently    Comment: remote h/o marijuana while in college  . Sexual activity: Not on file  Other Topics Concern  . Not on file    Social History Narrative  . Not on file   Social Determinants of Health   Financial Resource Strain:   . Difficulty of Paying Living Expenses:   Food Insecurity:   . Worried About Charity fundraiser in the Last Year:   . Arboriculturist in the Last Year:   Transportation Needs:   . Film/video editor (Medical):   Marland Kitchen Lack of Transportation (Non-Medical):   Physical Activity:   . Days of Exercise per Week:   . Minutes of Exercise per Session:   Stress:   . Feeling of Stress :   Social Connections:   . Frequency of Communication with Friends and Family:   . Frequency of Social Gatherings with Friends and Family:   . Attends Religious Services:   . Active Member of Clubs or Organizations:   . Attends Archivist Meetings:   Marland Kitchen Marital Status:   Intimate Partner Violence:   . Fear of Current or Ex-Partner:   . Emotionally Abused:   Marland Kitchen Physically Abused:   . Sexually Abused:     ROS Review of Systems  Constitutional: Negative.   HENT: Negative.   Respiratory: Negative.   Cardiovascular: Negative.   Gastrointestinal: Negative for abdominal distention, abdominal pain, constipation, diarrhea, nausea and vomiting.  Genitourinary: Negative.   Neurological:  Negative for light-headedness and headaches.  Hematological: Does not bruise/bleed easily.  Psychiatric/Behavioral: Negative.     Objective:   Today's Vitals: BP 122/74   Pulse 92   Temp (!) 97.5 F (36.4 C) (Tympanic)   Ht 5\' 11"  (1.803 m)   Wt 150 lb 3.2 oz (68.1 kg)   SpO2 97%   BMI 20.95 kg/m   Physical Exam Vitals and nursing note reviewed.  Constitutional:      General: He is not in acute distress.    Appearance: Normal appearance. He is normal weight. He is not ill-appearing, toxic-appearing or diaphoretic.  HENT:     Head: Normocephalic and atraumatic.     Right Ear: External ear normal.     Left Ear: External ear normal.  Eyes:     General: No scleral icterus.       Right eye: No  discharge.        Left eye: No discharge.     Extraocular Movements: Extraocular movements intact.     Conjunctiva/sclera: Conjunctivae normal.     Pupils: Pupils are equal, round, and reactive to light.  Cardiovascular:     Rate and Rhythm: Normal rate and regular rhythm.  Pulmonary:     Effort: Pulmonary effort is normal.     Breath sounds: Normal breath sounds.  Abdominal:     General: Abdomen is flat. Bowel sounds are normal. There is no distension.     Palpations: Abdomen is soft. There is no mass.     Tenderness: There is no abdominal tenderness. There is no guarding or rebound.     Hernia: No hernia is present.    Musculoskeletal:     Cervical back: No rigidity or tenderness.  Lymphadenopathy:     Cervical: No cervical adenopathy.  Skin:    General: Skin is warm and dry.  Neurological:     Mental Status: He is alert and oriented to person, place, and time.  Psychiatric:        Mood and Affect: Mood normal.        Behavior: Behavior normal.     Assessment & Plan:   Problem List Items Addressed This Visit      Cardiovascular and Mediastinum   Essential hypertension - Primary (Chronic)   Relevant Medications   amLODipine (NORVASC) 10 MG tablet   hydrochlorothiazide (MICROZIDE) 12.5 MG capsule   metoprolol tartrate (LOPRESSOR) 25 MG tablet   Other Relevant Orders   CBC   Comprehensive metabolic panel     Other   Hospital discharge follow-up      Outpatient Encounter Medications as of 08/17/2019  Medication Sig  . acetaminophen (TYLENOL) 500 MG tablet Take 1-2 tablets (500-1,000 mg total) by mouth every 6 (six) hours as needed for mild pain.  08/19/2019 amLODipine (NORVASC) 10 MG tablet Take 1 tablet (10 mg total) by mouth daily.  . hydrochlorothiazide (MICROZIDE) 12.5 MG capsule Take 1 capsule (12.5 mg total) by mouth daily.  . metoprolol tartrate (LOPRESSOR) 25 MG tablet Take 1 tablet (25 mg total) by mouth 2 (two) times daily.  . sodium chloride flush (NS) 0.9 %  SOLN 5 mLs by Intracatheter route daily for 28 days. Flush drain once daily with 63mL normal saline  . [DISCONTINUED] amLODipine (NORVASC) 10 MG tablet Take 1 tablet (10 mg total) by mouth daily.  . [DISCONTINUED] hydrochlorothiazide (MICROZIDE) 12.5 MG capsule Take 1 capsule (12.5 mg total) by mouth daily.  . [DISCONTINUED] metoprolol tartrate (LOPRESSOR) 25 MG tablet Take 1 tablet (  25 mg total) by mouth 2 (two) times daily.  . methocarbamol (ROBAXIN) 500 MG tablet Take 1 tablet (500 mg total) by mouth every 8 (eight) hours as needed for muscle spasms. (Patient not taking: Reported on 08/17/2019)  . polyethylene glycol (MIRALAX / GLYCOLAX) 17 g packet Take 17 g by mouth daily as needed for mild constipation. (Patient not taking: Reported on 08/17/2019)   No facility-administered encounter medications on file as of 08/17/2019.    Follow-up: Return in about 2 months (around 10/17/2019).    Patient was given information on managing hypertension as well as post appendectomy care. Mliss Sax, MD

## 2019-08-17 NOTE — Patient Instructions (Signed)
Managing Your Hypertension Hypertension is commonly called high blood pressure. This is when the force of your blood pressing against the walls of your arteries is too strong. Arteries are blood vessels that carry blood from your heart throughout your body. Hypertension forces the heart to work harder to pump blood, and may cause the arteries to become narrow or stiff. Having untreated or uncontrolled hypertension can cause heart attack, stroke, kidney disease, and other problems. What are blood pressure readings? A blood pressure reading consists of a higher number over a lower number. Ideally, your blood pressure should be below 120/80. The first ("top") number is called the systolic pressure. It is a measure of the pressure in your arteries as your heart beats. The second ("bottom") number is called the diastolic pressure. It is a measure of the pressure in your arteries as the heart relaxes. What does my blood pressure reading mean? Blood pressure is classified into four stages. Based on your blood pressure reading, your health care provider may use the following stages to determine what type of treatment you need, if any. Systolic pressure and diastolic pressure are measured in a unit called mm Hg. Normal  Systolic pressure: below 120.  Diastolic pressure: below 80. Elevated  Systolic pressure: 120-129.  Diastolic pressure: below 80. Hypertension stage 1  Systolic pressure: 130-139.  Diastolic pressure: 80-89. Hypertension stage 2  Systolic pressure: 140 or above.  Diastolic pressure: 90 or above. What health risks are associated with hypertension? Managing your hypertension is an important responsibility. Uncontrolled hypertension can lead to:  A heart attack.  A stroke.  A weakened blood vessel (aneurysm).  Heart failure.  Kidney damage.  Eye damage.  Metabolic syndrome.  Memory and concentration problems. What changes can I make to manage my  hypertension? Hypertension can be managed by making lifestyle changes and possibly by taking medicines. Your health care provider will help you make a plan to bring your blood pressure within a normal range. Eating and drinking   Eat a diet that is high in fiber and potassium, and low in salt (sodium), added sugar, and fat. An example eating plan is called the DASH (Dietary Approaches to Stop Hypertension) diet. To eat this way: ? Eat plenty of fresh fruits and vegetables. Try to fill half of your plate at each meal with fruits and vegetables. ? Eat whole grains, such as whole wheat pasta, brown rice, or whole grain bread. Fill about one quarter of your plate with whole grains. ? Eat low-fat diary products. ? Avoid fatty cuts of meat, processed or cured meats, and poultry with skin. Fill about one quarter of your plate with lean proteins such as fish, chicken without skin, beans, eggs, and tofu. ? Avoid premade and processed foods. These tend to be higher in sodium, added sugar, and fat.  Reduce your daily sodium intake. Most people with hypertension should eat less than 1,500 mg of sodium a day.  Limit alcohol intake to no more than 1 drink a day for nonpregnant women and 2 drinks a day for men. One drink equals 12 oz of beer, 5 oz of wine, or 1 oz of hard liquor. Lifestyle  Work with your health care provider to maintain a healthy body weight, or to lose weight. Ask what an ideal weight is for you.  Get at least 30 minutes of exercise that causes your heart to beat faster (aerobic exercise) most days of the week. Activities may include walking, swimming, or biking.  Include exercise   to strengthen your muscles (resistance exercise), such as weight lifting, as part of your weekly exercise routine. Try to do these types of exercises for 30 minutes at least 3 days a week.  Do not use any products that contain nicotine or tobacco, such as cigarettes and e-cigarettes. If you need help quitting,  ask your health care provider.  Control any long-term (chronic) conditions you have, such as high cholesterol or diabetes. Monitoring  Monitor your blood pressure at home as told by your health care provider. Your personal target blood pressure may vary depending on your medical conditions, your age, and other factors.  Have your blood pressure checked regularly, as often as told by your health care provider. Working with your health care provider  Review all the medicines you take with your health care provider because there may be side effects or interactions.  Talk with your health care provider about your diet, exercise habits, and other lifestyle factors that may be contributing to hypertension.  Visit your health care provider regularly. Your health care provider can help you create and adjust your plan for managing hypertension. Will I need medicine to control my blood pressure? Your health care provider may prescribe medicine if lifestyle changes are not enough to get your blood pressure under control, and if:  Your systolic blood pressure is 130 or higher.  Your diastolic blood pressure is 80 or higher. Take medicines only as told by your health care provider. Follow the directions carefully. Blood pressure medicines must be taken as prescribed. The medicine does not work as well when you skip doses. Skipping doses also puts you at risk for problems. Contact a health care provider if:  You think you are having a reaction to medicines you have taken.  You have repeated (recurrent) headaches.  You feel dizzy.  You have swelling in your ankles.  You have trouble with your vision. Get help right away if:  You develop a severe headache or confusion.  You have unusual weakness or numbness, or you feel faint.  You have severe pain in your chest or abdomen.  You vomit repeatedly.  You have trouble breathing. Summary  Hypertension is when the force of blood pumping  through your arteries is too strong. If this condition is not controlled, it may put you at risk for serious complications.  Your personal target blood pressure may vary depending on your medical conditions, your age, and other factors. For most people, a normal blood pressure is less than 120/80.  Hypertension is managed by lifestyle changes, medicines, or both. Lifestyle changes include weight loss, eating a healthy, low-sodium diet, exercising more, and limiting alcohol. This information is not intended to replace advice given to you by your health care provider. Make sure you discuss any questions you have with your health care provider. Document Revised: 08/14/2018 Document Reviewed: 03/20/2016 Elsevier Patient Education  [place].  Open Appendectomy, Adult, Care After This sheet gives you information about how to care for yourself after your procedure. Your health care provider may also give you more specific instructions. If you have problems or questions, contact your health care provider. What can I expect after the procedure? After the procedure, it is common to have:  Little energy for normal activities.  Pain in the area where the incision was made.  Difficulty passing stool (constipation). This can be caused by: ? Pain medicine. ? A decrease in your activity. Follow these instructions at home: Medicines  Take over-the-counter and prescription  medicines only as told by your health care provider.  If you were prescribed an antibiotic medicine, take it as told by your health care provider. Do not stop taking the antibiotic even if you start to feel better.  Do not drive or use heavy machinery while taking prescription pain medicine.  Ask your health care provider if the medicine prescribed to you can cause constipation. You may need to take steps to prevent or treat constipation, such as: ? Drink enough fluid to keep your urine pale yellow. ? Take over-the-counter  or prescription medicines. ? Eat foods that are high in fiber, such as beans, whole grains, and fresh fruits and vegetables. ? Limit foods that are high in fat and processed sugars, such as fried or sweet foods. Incision care   Follow instructions from your health care provider about how to take care of your incision. Make sure you: ? Wash your hands with soap and water before and after you change your bandage (dressing). If soap and water are not available, use hand sanitizer. ? Change your dressing as told by your health care provider. ? Leave stitches (sutures), skin glue, or adhesive strips in place. These skin closures may need to stay in place for 2 weeks or longer. If adhesive strip edges start to loosen and curl up, you may trim the loose edges. Do not remove adhesive strips completely unless your health care provider tells you to do that.  Check your incision area every day for signs of infection. Check for: ? Redness, swelling, or pain. ? Fluid or blood. ? Warmth. ? Pus or a bad smell. Bathing  Keep your incision clean and dry. Clean it as often as told by your health care provider. To do this: 1. Gently wash the incision with soap and water. 2. Rinse the incision with water to remove all soap. 3. Pat the incision dry with a clean towel. Do not rub the incision.  Do not take baths, swim, or use a hot tub for 2 weeks, or until your health care provider approves. You may take showers after 48 hours. Activity   Do not drive for 24 hours if you were given a sedative during your procedure.  Return to your normal activities as told by your health care provider. Ask your health care provider what activities are safe for you.  Avoid sitting for a long time without moving. Get up to take short walks every 1-2 hours. This is important to improve blood flow and breathing. Ask for help if you feel weak or unsteady.  For 3 weeks, or for as long as told by your health care  provider: ? Do not lift anything that is heavier than 10 lb (4.5 kg), or the limit that you are told. ? Do not play contact sports. General instructions  If you were sent home with a drain, follow instructions from your health care provider about how to care for the drain and how to empty it.  Take deep breaths. This helps to prevent your lungs from developing an infection (pneumonia).  Keep all follow-up visits as told by your health care provider. This is important. Contact a health care provider if:  You have redness, swelling, or pain at the site of your incision.  You have fluid or blood coming from your incision.  Your incision feels warm to the touch.  You have pus or a bad smell coming from your incision or dressing.  Your incision edges break open after your  sutures have been removed.  You have increasing pain in your shoulders.  You feel dizzy or you faint.  You develop shortness of breath.  You keep feeling nauseous or you are vomiting.  You have diarrhea or you cannot control your bowel functions.  You lose your appetite.  You develop swelling or pain in your legs.  You develop a rash. Get help right away if you have:  A fever.  Difficulty breathing.  Sharp pains in your chest. Summary  After an open appendectomy, it is common to have little energy for normal activities, pain in the incision area, and constipation.  Infection is the most common complication after this procedure. Follow your health care provider's instructions about caring for yourself after the procedure.  Return to your normal activities as told by your health care provider. Ask your health care provider what activities are safe for you.  Contact your health care provider if you notice signs of infection around your incision or you develop shortness of breath. Get help right away if you have a fever, chest pain, or difficulty breathing. This information is not intended to replace  advice given to you by your health care provider. Make sure you discuss any questions you have with your health care provider. Document Revised: 07/27/2018 Document Reviewed: 10/23/2017 Elsevier Patient Education  2020 [employer].

## 2019-08-19 ENCOUNTER — Ambulatory Visit
Admission: RE | Admit: 2019-08-19 | Discharge: 2019-08-19 | Disposition: A | Discharge status: Home / Self Care | Payer: No Typology Code available for payment source | Source: Ambulatory Visit | Attending: General Surgery | Admitting: General Surgery

## 2019-08-19 ENCOUNTER — Encounter: Payer: Self-pay | Admitting: *Deleted

## 2019-08-19 ENCOUNTER — Ambulatory Visit
Admission: RE | Admit: 2019-08-19 | Discharge: 2019-08-19 | Disposition: A | Discharge status: Home / Self Care | Payer: No Typology Code available for payment source | Source: Ambulatory Visit | Attending: Radiology | Admitting: Radiology

## 2019-08-19 DIAGNOSIS — K3533 Acute appendicitis with perforation and localized peritonitis, with abscess: Secondary | ICD-10-CM

## 2019-08-19 HISTORY — PX: IR RADIOLOGIST EVAL & MGMT: IMG5224

## 2019-08-19 MED ORDER — IOPAMIDOL (ISOVUE-300) INJECTION 61%
100.0000 mL | Freq: Once | INTRAVENOUS | Status: AC | PRN
  Administered 2019-08-19: 100 mL via INTRAVENOUS

## 2019-08-19 NOTE — Progress Notes (Addendum)
Referring Physician(s): Dr Dennis Bast  Chief Complaint: The patient is seen in follow up today s/p perforated appendix and abscess Drain placed in IR 07/27/19 - second drain placed in IR 08/03/19  History of present illness:  First drain placed 07/27/19-- did well and CT 3/29 showed findings of resolved abscess in RLQ. Drain was removed. CT also revealing new collection: lower central pelvis. New drain placed: left Transgluteal drain on 08/03/19  Pt now here for CT and evaluation---possible drain removal. Was Dc'd from hospital 4/1 with antibiotics for 1 week  Denies pain; bleeding Denies fever/chills  OP is minimal to none  Past Medical History:  Diagnosis Date  . Essential hypertension     No past surgical history on file.  Allergies: Patient has no known allergies.  Medications: Prior to Admission medications   Medication Sig Start Date End Date Taking? Authorizing Provider  acetaminophen (TYLENOL) 500 MG tablet Take 1-2 tablets (500-1,000 mg total) by mouth every 6 (six) hours as needed for mild pain. 08/05/19   Meuth, Brooke A, PA-C  amLODipine (NORVASC) 10 MG tablet Take 1 tablet (10 mg total) by mouth daily. 08/17/19   Libby Maw, MD  hydrochlorothiazide (MICROZIDE) 12.5 MG capsule Take 1 capsule (12.5 mg total) by mouth daily. 08/17/19   Libby Maw, MD  methocarbamol (ROBAXIN) 500 MG tablet Take 1 tablet (500 mg total) by mouth every 8 (eight) hours as needed for muscle spasms. Patient not taking: Reported on 08/17/2019 08/05/19   Margie Billet A, PA-C  metoprolol tartrate (LOPRESSOR) 25 MG tablet Take 1 tablet (25 mg total) by mouth 2 (two) times daily. 08/17/19   Libby Maw, MD  polyethylene glycol (MIRALAX / GLYCOLAX) 17 g packet Take 17 g by mouth daily as needed for mild constipation. Patient not taking: Reported on 08/17/2019 08/05/19   Margie Billet A, PA-C  sodium chloride flush (NS) 0.9 % SOLN 5 mLs by Intracatheter route daily for  28 days. Flush drain once daily with 81mL normal saline 08/05/19 09/02/19  Meuth, Blaine Hamper, PA-C     Family History  Problem Relation Age of Onset  . Hypertension Mother   . Hypertension Father     Social History   Socioeconomic History  . Marital status: Unknown    Spouse name: Not on file  . Number of children: Not on file  . Years of education: Not on file  . Highest education level: Not on file  Occupational History  . Occupation: works in a Oceanographer  . Smoking status: Never Smoker  . Smokeless tobacco: Never Used  Substance and Sexual Activity  . Alcohol use: Yes    Comment: rarely  . Drug use: Not Currently    Comment: remote h/o marijuana while in college  . Sexual activity: Not on file  Other Topics Concern  . Not on file  Social History Narrative  . Not on file   Social Determinants of Health   Financial Resource Strain:   . Difficulty of Paying Living Expenses:   Food Insecurity:   . Worried About Charity fundraiser in the Last Year:   . Arboriculturist in the Last Year:   Transportation Needs:   . Film/video editor (Medical):   Marland Kitchen Lack of Transportation (Non-Medical):   Physical Activity:   . Days of Exercise per Week:   . Minutes of Exercise per Session:   Stress:   . Feeling of Stress :  Social Connections:   . Frequency of Communication with Friends and Family:   . Frequency of Social Gatherings with Friends and Family:   . Attends Religious Services:   . Active Member of Clubs or Organizations:   . Attends Banker Meetings:   Marland Kitchen Marital Status:      Vital Signs: There were no vitals taken for this visit.  Physical Exam Skin:    General: Skin is warm and dry.     Comments: Skin site is clean and dry NT no bleeding No sign of infection Small amount of serous fluid in JP  CT revealing resolved collection per Dr Gretel Acre removal without complication per Dr Deanne Coffer order Dressing placed      Imaging: No results found.  Labs:  CBC: Recent Labs    08/03/19 0640 08/04/19 0515 08/05/19 0136 08/17/19 1213  WBC 26.9* 22.7* 19.7* 13.5*  HGB 12.6* 11.6* 11.7* 9.8*  HCT 37.5* 35.2* 36.3* 29.5*  PLT 620* 593* 649* 543.0*    COAGS: Recent Labs    07/27/19 0506  INR 1.3*    BMP: Recent Labs    08/02/19 0537 08/02/19 0537 08/03/19 0640 08/04/19 0515 08/05/19 0136 08/17/19 1213  NA 134*   < > 134* 134* 138 139  K 4.0   < > 4.2 4.3 4.8 4.2  CL 95*   < > 94* 95* 99 101  CO2 28   < > 27 27 27 29   GLUCOSE 118*   < > 99 168* 140* 95  BUN 11   < > 13 14 17  27*  CALCIUM 8.4*   < > 8.9 8.9 9.0 9.3  CREATININE 0.93   < > 1.00 0.93 1.05 0.86  GFRNONAA >60  --  >60 >60 >60  --   GFRAA >60  --  >60 >60 >60  --    < > = values in this interval not displayed.    LIVER FUNCTION TESTS: Recent Labs    08/03/19 0640 08/04/19 0515 08/05/19 0136 08/17/19 1213  BILITOT 1.1 0.8 0.6 0.4  AST 35 36 30 12  ALT 58* 63* 62* 19  ALKPHOS 91 103 110 85  PROT 6.2* 5.9* 6.6 7.0  ALBUMIN 2.6* 2.5* 2.8* 4.0    Assessment:  Perforated appendix and abscess 07/27/19 Abscess resolved - and drain removed- yet CT 3/30 did reveal new collection Left TG drain placed 3/30 in IR CT today revealing resolved collection Drain removal per Dr 4/30 complication Follow with [facility] as needed  Signed: 4/30, PA-C 08/19/2019, 1:54 PM   Please refer to Dr. Robet Leu attestation of this note for management and plan.

## 2019-08-24 ENCOUNTER — Other Ambulatory Visit: Payer: Self-pay | Admitting: General Surgery

## 2019-08-24 ENCOUNTER — Ambulatory Visit: Payer: No Typology Code available for payment source | Admitting: Registered Nurse

## 2019-09-21 ENCOUNTER — Inpatient Hospital Stay (HOSPITAL_COMMUNITY): Admission: RE | Admit: 2019-09-21 | Payer: No Typology Code available for payment source | Source: Ambulatory Visit

## 2019-09-24 ENCOUNTER — Other Ambulatory Visit (HOSPITAL_COMMUNITY): Payer: No Typology Code available for payment source

## 2019-09-28 ENCOUNTER — Ambulatory Visit: Admit: 2019-09-28 | Payer: No Typology Code available for payment source | Admitting: General Surgery

## 2019-09-28 SURGERY — APPENDECTOMY, LAPAROSCOPIC
Anesthesia: General

## 2019-10-01 ENCOUNTER — Telehealth: Payer: Self-pay | Admitting: Family Medicine

## 2019-10-01 DIAGNOSIS — I10 Essential (primary) hypertension: Secondary | ICD-10-CM

## 2019-10-01 MED ORDER — METOPROLOL TARTRATE 25 MG PO TABS: 25.0000 mg | ORAL_TABLET | Freq: Two times a day (BID) | ORAL | 1 refills | Status: DC

## 2019-10-01 MED ORDER — HYDROCHLOROTHIAZIDE 12.5 MG PO CAPS: 12.5000 mg | ORAL_CAPSULE | Freq: Every day | ORAL | 1 refills | Status: DC

## 2019-10-01 MED ORDER — AMLODIPINE BESYLATE 10 MG PO TABS: 10.0000 mg | ORAL_TABLET | Freq: Every day | ORAL | 1 refills | Status: DC

## 2019-10-01 NOTE — Telephone Encounter (Signed)
Patient is calling and requesting a refill for amlodipine, hydrochlorothiazide and metoprolol tartrate sent to [facility], [place] on [address] [facility]. CB is [phone]

## 2019-10-07 ENCOUNTER — Other Ambulatory Visit: Payer: Self-pay

## 2019-10-07 DIAGNOSIS — I10 Essential (primary) hypertension: Secondary | ICD-10-CM

## 2019-10-07 MED ORDER — AMLODIPINE BESYLATE 10 MG PO TABS: 10.0000 mg | ORAL_TABLET | Freq: Every day | ORAL | 1 refills | Status: DC

## 2019-10-07 MED ORDER — HYDROCHLOROTHIAZIDE 12.5 MG PO CAPS: 12.5000 mg | ORAL_CAPSULE | Freq: Every day | ORAL | 1 refills | Status: DC

## 2019-10-07 MED ORDER — METOPROLOL TARTRATE 25 MG PO TABS: 25.0000 mg | ORAL_TABLET | Freq: Two times a day (BID) | ORAL | 1 refills | Status: DC

## 2019-10-07 NOTE — Telephone Encounter (Signed)
Patient is calling for status of these refill requests. States he has now run out of these medications.

## 2019-10-07 NOTE — Telephone Encounter (Signed)
Rx sent in patient aware and will pick up  

## 2019-10-08 ENCOUNTER — Other Ambulatory Visit: Payer: Self-pay

## 2019-10-08 DIAGNOSIS — I10 Essential (primary) hypertension: Secondary | ICD-10-CM

## 2019-10-08 MED ORDER — AMLODIPINE BESYLATE 10 MG PO TABS: 10.0000 mg | ORAL_TABLET | Freq: Every day | ORAL | 1 refills | Status: DC

## 2019-10-08 MED ORDER — HYDROCHLOROTHIAZIDE 12.5 MG PO CAPS: 12.5000 mg | ORAL_CAPSULE | Freq: Every day | ORAL | 1 refills | Status: DC

## 2019-10-08 MED ORDER — METOPROLOL TARTRATE 25 MG PO TABS: 25.0000 mg | ORAL_TABLET | Freq: Two times a day (BID) | ORAL | 1 refills | Status: DC

## 2019-10-18 ENCOUNTER — Other Ambulatory Visit: Payer: Self-pay

## 2019-10-18 ENCOUNTER — Encounter: Payer: Self-pay | Admitting: Family Medicine

## 2019-10-18 ENCOUNTER — Ambulatory Visit (INDEPENDENT_AMBULATORY_CARE_PROVIDER_SITE_OTHER): Payer: No Typology Code available for payment source | Admitting: Family Medicine

## 2019-10-18 VITALS — BP 114/78 | HR 97 | Temp 98.6°F | Ht 71.0 in | Wt 153.2 lb

## 2019-10-18 DIAGNOSIS — I10 Essential (primary) hypertension: Secondary | ICD-10-CM | POA: Diagnosis not present

## 2019-10-18 DIAGNOSIS — Z0000 Encounter for general adult medical examination without abnormal findings: Secondary | ICD-10-CM | POA: Diagnosis not present

## 2019-10-18 LAB — BASIC METABOLIC PANEL
BUN: 20 mg/dL (ref 6–23)
CO2: 28 mEq/L (ref 19–32)
Calcium: 9.9 mg/dL (ref 8.4–10.5)
Chloride: 102 mEq/L (ref 96–112)
Creatinine, Ser: 0.9 mg/dL (ref 0.40–1.50)
GFR: 108.16 mL/min (ref >60.00)
Glucose, Bld: 116 mg/dL — ABNORMAL HIGH (ref 70–99)
Potassium: 4.1 mEq/L (ref 3.5–5.1)
Sodium: 139 mEq/L (ref 135–145)

## 2019-10-18 MED ORDER — HYDROCHLOROTHIAZIDE 12.5 MG PO CAPS: 12.5000 mg | ORAL_CAPSULE | Freq: Every day | ORAL | 1 refills | Status: AC

## 2019-10-18 MED ORDER — AMLODIPINE BESYLATE 10 MG PO TABS: 10.0000 mg | ORAL_TABLET | Freq: Every day | ORAL | 1 refills | Status: AC

## 2019-10-18 NOTE — Patient Instructions (Signed)
   Managing Your Hypertension Hypertension is commonly called high blood pressure. This is when the force of your blood pressing against the walls of your arteries is too strong. Arteries are blood vessels that carry blood from your heart throughout your body. Hypertension forces the heart to work harder to pump blood, and may cause the arteries to become narrow or stiff. Having untreated or uncontrolled hypertension can cause heart attack, stroke, kidney disease, and other problems. What are blood pressure readings? A blood pressure reading consists of a higher number over a lower number. Ideally, your blood pressure should be below 120/80. The first ("top") number is called the systolic pressure. It is a measure of the pressure in your arteries as your heart beats. The second ("bottom") number is called the diastolic pressure. It is a measure of the pressure in your arteries as the heart relaxes. What does my blood pressure reading mean? Blood pressure is classified into four stages. Based on your blood pressure reading, your health care provider may use the following stages to determine what type of treatment you need, if any. Systolic pressure and diastolic pressure are measured in a unit called mm Hg. Normal  Systolic pressure: below 120.  Diastolic pressure: below 80. Elevated  Systolic pressure: 120-129.  Diastolic pressure: below 80. Hypertension stage 1  Systolic pressure: 130-139.  Diastolic pressure: 80-89. Hypertension stage 2  Systolic pressure: 140 or above.  Diastolic pressure: 90 or above. What health risks are associated with hypertension? Managing your hypertension is an important responsibility. Uncontrolled hypertension can lead to:  A heart attack.  A stroke.  A weakened blood vessel (aneurysm).  Heart failure.  Kidney damage.  Eye damage.  Metabolic syndrome.  Memory and concentration problems. What changes can I make to manage my  hypertension? Hypertension can be managed by making lifestyle changes and possibly by taking medicines. Your health care provider will help you make a plan to bring your blood pressure within a normal range. Eating and drinking   Eat a diet that is high in fiber and potassium, and low in salt (sodium), added sugar, and fat. An example eating plan is called the DASH (Dietary Approaches to Stop Hypertension) diet. To eat this way: ? Eat plenty of fresh fruits and vegetables. Try to fill half of your plate at each meal with fruits and vegetables. ? Eat whole grains, such as whole wheat pasta, brown rice, or whole grain bread. Fill about one quarter of your plate with whole grains. ? Eat low-fat diary products. ? Avoid fatty cuts of meat, processed or cured meats, and poultry with skin. Fill about one quarter of your plate with lean proteins such as fish, chicken without skin, beans, eggs, and tofu. ? Avoid premade and processed foods. These tend to be higher in sodium, added sugar, and fat.  Reduce your daily sodium intake. Most people with hypertension should eat less than 1,500 mg of sodium a day.  Limit alcohol intake to no more than 1 drink a day for nonpregnant women and 2 drinks a day for men. One drink equals 12 oz of beer, 5 oz of wine, or 1 oz of hard liquor. Lifestyle  Work with your health care provider to maintain a healthy body weight, or to lose weight. Ask what an ideal weight is for you.  Get at least 30 minutes of exercise that causes your heart to beat faster (aerobic exercise) most days of the week. Activities may include walking, swimming, or biking.    Include exercise to strengthen your muscles (resistance exercise), such as weight lifting, as part of your weekly exercise routine. Try to do these types of exercises for 30 minutes at least 3 days a week.  Do not use any products that contain nicotine or tobacco, such as cigarettes and e-cigarettes. If you need help quitting,  ask your health care provider.  Control any long-term (chronic) conditions you have, such as high cholesterol or diabetes. Monitoring  Monitor your blood pressure at home as told by your health care provider. Your personal target blood pressure may vary depending on your medical conditions, your age, and other factors.  Have your blood pressure checked regularly, as often as told by your health care provider. Working with your health care provider  Review all the medicines you take with your health care provider because there may be side effects or interactions.  Talk with your health care provider about your diet, exercise habits, and other lifestyle factors that may be contributing to hypertension.  Visit your health care provider regularly. Your health care provider can help you create and adjust your plan for managing hypertension. Will I need medicine to control my blood pressure? Your health care provider may prescribe medicine if lifestyle changes are not enough to get your blood pressure under control, and if:  Your systolic blood pressure is 130 or higher.  Your diastolic blood pressure is 80 or higher. Take medicines only as told by your health care provider. Follow the directions carefully. Blood pressure medicines must be taken as prescribed. The medicine does not work as well when you skip doses. Skipping doses also puts you at risk for problems. Contact a health care provider if:  You think you are having a reaction to medicines you have taken.  You have repeated (recurrent) headaches.  You feel dizzy.  You have swelling in your ankles.  You have trouble with your vision. Get help right away if:  You develop a severe headache or confusion.  You have unusual weakness or numbness, or you feel faint.  You have severe pain in your chest or abdomen.  You vomit repeatedly.  You have trouble breathing. Summary  Hypertension is when the force of blood pumping  through your arteries is too strong. If this condition is not controlled, it may put you at risk for serious complications.  Your personal target blood pressure may vary depending on your medical conditions, your age, and other factors. For most people, a normal blood pressure is less than 120/80.  Hypertension is managed by lifestyle changes, medicines, or both. Lifestyle changes include weight loss, eating a healthy, low-sodium diet, exercising more, and limiting alcohol. This information is not intended to replace advice given to you by your health care provider. Make sure you discuss any questions you have with your health care provider. Document Revised: 08/14/2018 Document Reviewed: 03/20/2016 Elsevier Patient Education  2020 [employer]  

## 2019-10-18 NOTE — Progress Notes (Signed)
Established Patient Office Visit  Subjective:  Patient ID: Russell Wells, male    DOB: 30-Jun-1970  Age: 49 y.o. MRN: [id number]  CC:  Chief Complaint  Patient presents with   Hypertension    Patient is here today for a 26-month-F/U HTN.  He takes Amlodipine 10mg , HCTZ 12.5mg , Metoprolol 25mg  bid. He has not been taking the Metoprolol bid as the pharmacy said they couldn't fill it until 6.21.21 so he has been out of that since [facility] said they were out of stock till then. He is not currently fasting.    HPI Russell Wells presents for follow-up of his blood pressure.  No problems with the medications.  Out of the metoprolol for about a month now.  Blood pressure is been doing well without it.  Status post Covid vaccine.  Past Medical History:  Diagnosis Date   Essential hypertension     Past Surgical History:  Procedure Laterality Date   IR RADIOLOGIST EVAL & MGMT  08/19/2019    Family History  Problem Relation Age of Onset   Hypertension Mother    Hypertension Father     Social History   Socioeconomic History   Marital status: Unknown    Spouse name: Not on file   Number of children: Not on file   Years of education: Not on file   Highest education level: Not on file  Occupational History   Occupation: works in a warehouse  Tobacco Use   Smoking status: Never Smoker   Smokeless tobacco: Never Used  Scientific laboratory technician Use: Never used  Substance and Sexual Activity   Alcohol use: Yes    Comment: rarely   Drug use: Not Currently    Comment: remote h/o marijuana while in college   Sexual activity: Not on file  Other Topics Concern   Not on file  Social History Narrative   Not on file   Social Determinants of Health   Financial Resource Strain:    Difficulty of Paying Living Expenses:   Food Insecurity:    Worried About Charity fundraiser in the Last Year:    Arboriculturist in the Last Year:   Transportation  Needs:    Film/video editor (Medical):    Lack of Transportation (Non-Medical):   Physical Activity:    Days of Exercise per Week:    Minutes of Exercise per Session:   Stress:    Feeling of Stress :   Social Connections:    Frequency of Communication with Friends and Family:    Frequency of Social Gatherings with Friends and Family:    Attends Religious Services:    Active Member of Clubs or Organizations:    Attends Music therapist:    Marital Status:   Intimate Partner Violence:    Fear of Current or Ex-Partner:    Emotionally Abused:    Physically Abused:    Sexually Abused:     Outpatient Medications Prior to Visit  Medication Sig Dispense Refill   amLODipine (NORVASC) 10 MG tablet Take 1 tablet (10 mg total) by mouth daily. 30 tablet 1   hydrochlorothiazide (MICROZIDE) 12.5 MG capsule Take 1 capsule (12.5 mg total) by mouth daily. 30 capsule 1   acetaminophen (TYLENOL) 500 MG tablet Take 1-2 tablets (500-1,000 mg total) by mouth every 6 (six) hours as needed for mild pain. (Patient taking differently: Take 1,000 mg by mouth every 6 (six) hours as needed for mild  pain. ) 30 tablet 0   methocarbamol (ROBAXIN) 500 MG tablet Take 1 tablet (500 mg total) by mouth every 8 (eight) hours as needed for muscle spasms. (Patient not taking: Reported on 08/17/2019) 30 tablet 0   metoprolol tartrate (LOPRESSOR) 25 MG tablet Take 1 tablet (25 mg total) by mouth 2 (two) times daily. (Patient not taking: Reported on 10/18/2019) 60 tablet 1   polyethylene glycol (MIRALAX / GLYCOLAX) 17 g packet Take 17 g by mouth daily as needed for mild constipation. (Patient not taking: Reported on 08/17/2019) 14 each 0   No facility-administered medications prior to visit.    No Known Allergies  ROS Review of Systems  Constitutional: Negative.   Respiratory: Negative.   Cardiovascular: Negative.   Gastrointestinal: Negative.   Neurological: Negative.         Objective:    Physical Exam Nursing note reviewed.  Constitutional:      General: He is not in acute distress.    Appearance: Normal appearance. He is normal weight. He is not ill-appearing, toxic-appearing or diaphoretic.  HENT:     Head: Normocephalic and atraumatic.     Right Ear: Tympanic membrane, ear canal and external ear normal.     Left Ear: Tympanic membrane, ear canal and external ear normal.     Mouth/Throat:     Mouth: Mucous membranes are moist.     Pharynx: Oropharynx is clear. No oropharyngeal exudate or posterior oropharyngeal erythema.  Eyes:     General: No scleral icterus.       Right eye: No discharge.        Left eye: No discharge.     Extraocular Movements: Extraocular movements intact.     Pupils: Pupils are equal, round, and reactive to light.  Cardiovascular:     Rate and Rhythm: Normal rate and regular rhythm.     Heart sounds: Murmur heard.   Pulmonary:     Effort: Pulmonary effort is normal.     Breath sounds: Normal breath sounds.  Musculoskeletal:     Cervical back: Normal range of motion and neck supple. No rigidity or tenderness.  Skin:    General: Skin is warm and dry.  Neurological:     Mental Status: He is alert and oriented to person, place, and time.  Psychiatric:        Mood and Affect: Mood normal.        Behavior: Behavior normal.     BP 114/78 (BP Location: Left Arm, Patient Position: Sitting, Cuff Size: Normal)    Pulse 97    Temp 98.6 F (37 C) (Temporal)    Ht 5\' 11"  (1.803 m)    Wt 153 lb 3.2 oz (69.5 kg)    SpO2 98%    BMI 21.37 kg/m  Wt Readings from Last 3 Encounters:  10/18/19 153 lb 3.2 oz (69.5 kg)  08/17/19 150 lb 3.2 oz (68.1 kg)  07/26/19 152 lb (68.9 kg)     Health Maintenance Due  Topic Date Due   Hepatitis C Screening  Never done   COVID-19 Vaccine (1) Never done   TETANUS/TDAP  Never done    There are no preventive care reminders to display for this patient.  No results found for: TSH Lab  Results  Component Value Date   WBC 13.5 (H) 08/17/2019   HGB 9.8 (L) 08/17/2019   HCT 29.5 (L) 08/17/2019   MCV 93.5 08/17/2019   PLT 543.0 (H) 08/17/2019   Lab Results  Component  Value Date   NA 139 08/17/2019   K 4.2 08/17/2019   CO2 29 08/17/2019   GLUCOSE 95 08/17/2019   BUN 27 (H) 08/17/2019   CREATININE 0.86 08/17/2019   BILITOT 0.4 08/17/2019   ALKPHOS 85 08/17/2019   AST 12 08/17/2019   ALT 19 08/17/2019   PROT 7.0 08/17/2019   ALBUMIN 4.0 08/17/2019   CALCIUM 9.3 08/17/2019   ANIONGAP 12 08/05/2019   GFR 114.07 08/17/2019   No results found for: CHOL No results found for: HDL No results found for: LDLCALC No results found for: TRIG No results found for: CHOLHDL No results found for: ZMOQ9U    Assessment & Plan:   Problem List Items Addressed This Visit      Cardiovascular and Mediastinum   Essential hypertension - Primary (Chronic)   Relevant Medications   hydrochlorothiazide (MICROZIDE) 12.5 MG capsule   amLODipine (NORVASC) 10 MG tablet   Other Relevant Orders   Basic metabolic panel   Hepatitis C antibody    Other Visit Diagnoses    Healthcare maintenance          Meds ordered this encounter  Medications   hydrochlorothiazide (MICROZIDE) 12.5 MG capsule    Sig: Take 1 capsule (12.5 mg total) by mouth daily.    Dispense:  100 capsule    Refill:  1   amLODipine (NORVASC) 10 MG tablet    Sig: Take 1 tablet (10 mg total) by mouth daily.    Dispense:  100 tablet    Refill:  1    Follow-up: Return in about 6 months (around 04/18/2020).  Given information on managing blood pressure.  Have discontinued metoprolol.  Follow-up in 6 months fasting for his 49 year old physical.  Mliss Sax, MD

## 2019-10-19 LAB — HEPATITIS C ANTIBODY
Hepatitis C Ab: NONREACTIVE
SIGNAL TO CUT-OFF: 0.02 (ref <1.00)

## 2020-04-18 ENCOUNTER — Ambulatory Visit: Payer: No Typology Code available for payment source | Admitting: Family Medicine

## 2021-02-18 IMAGING — CT CT ABD-PELV W/O CM
2 of 4 series · 15 of 46 positions shown, 17 images · non-contrast
Comparison: 07/27/2019

CLINICAL DATA: RIGHT lower quadrant abscess secondary to acute
appendicitis, post percutaneous drainage yesterday, horrible
abdominal pain since procedure from umbilicus to RIGHT lateral
abdomen

EXAM:
CT ABDOMEN AND PELVIS WITHOUT CONTRAST
TECHNIQUE: Multidetector CT imaging of the abdomen and pelvis was performed
following the standard protocol without IV contrast. Sagittal and
coronal MPR images reconstructed from axial data set. Neither oral
nor intravenous contrast administered.

[Series 3: abd/ pelvis 5.0 i30f 2 · axial · 0.66mm/px · z∈[+768,+1208]mm · 12 of 102 slices shown, 14 images]
[im 9/102  soft-tissue]
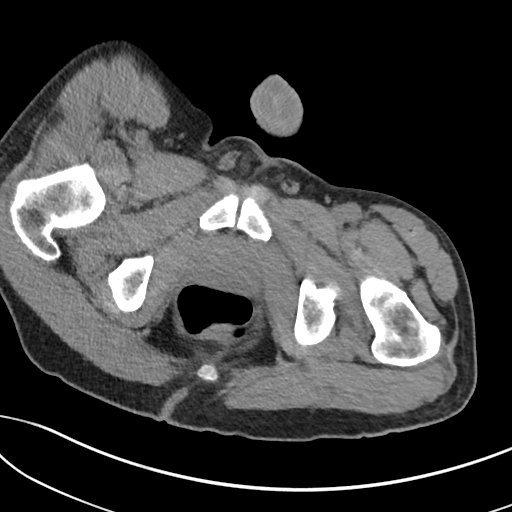
[im 9/102  bone]
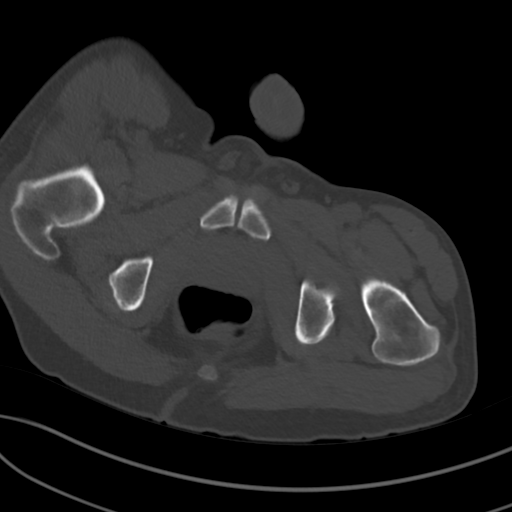
[im 17/102  soft-tissue]
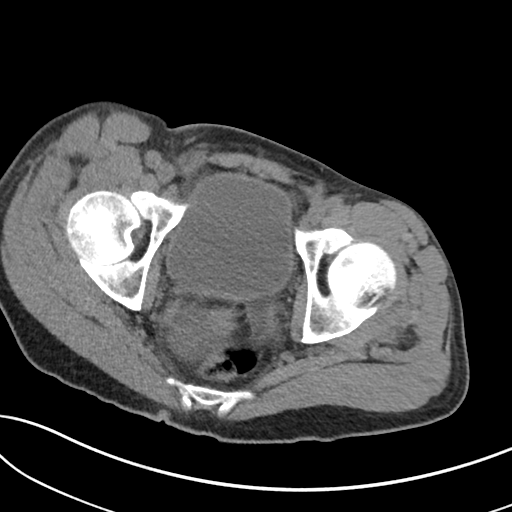
[im 25/102  soft-tissue]
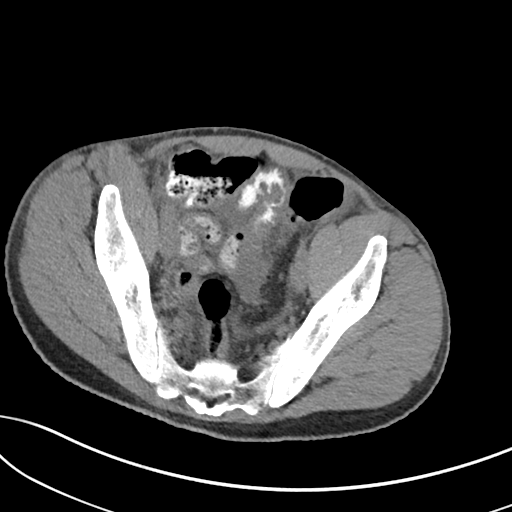
[im 33/102  soft-tissue]
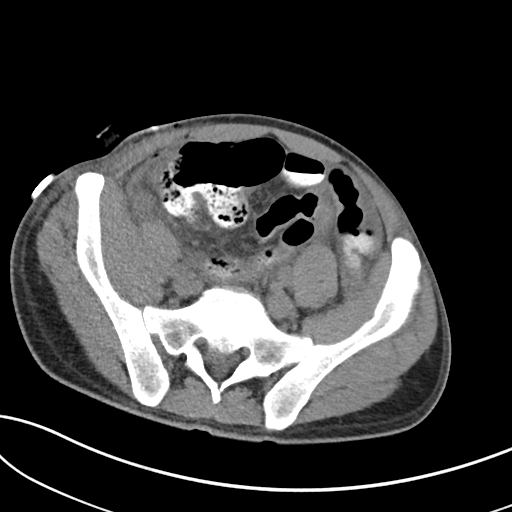
[im 41/102  soft-tissue]
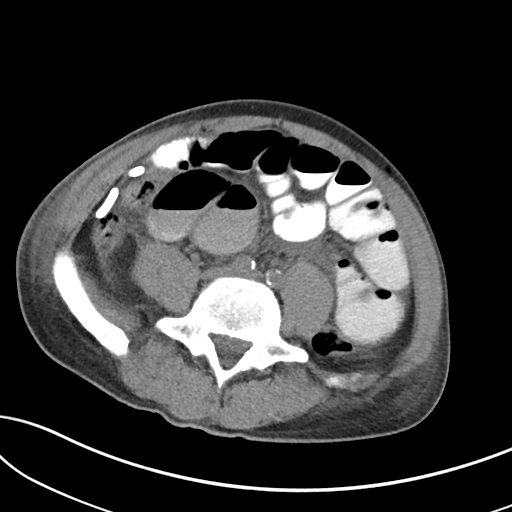
[im 49/102  soft-tissue]
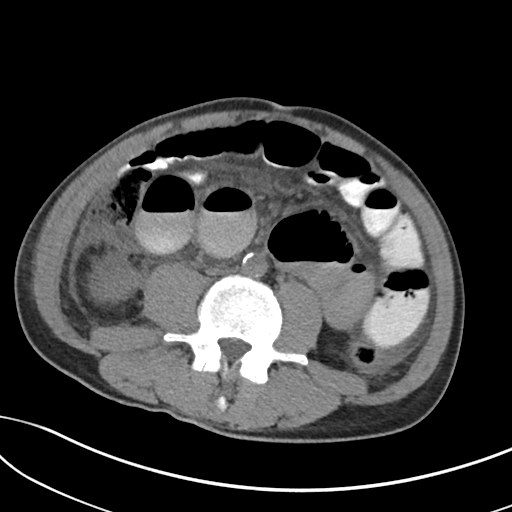
[im 57/102  soft-tissue]
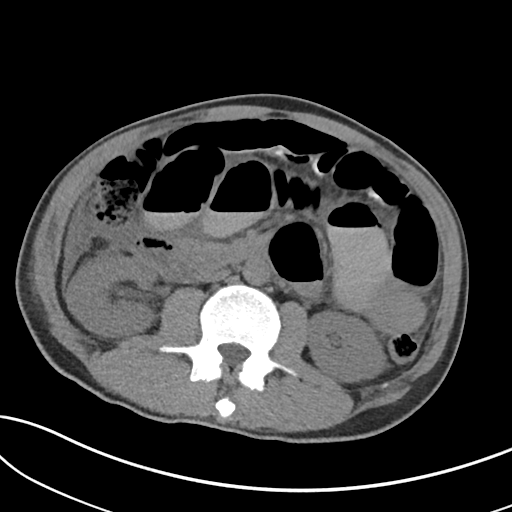
[im 65/102  soft-tissue]
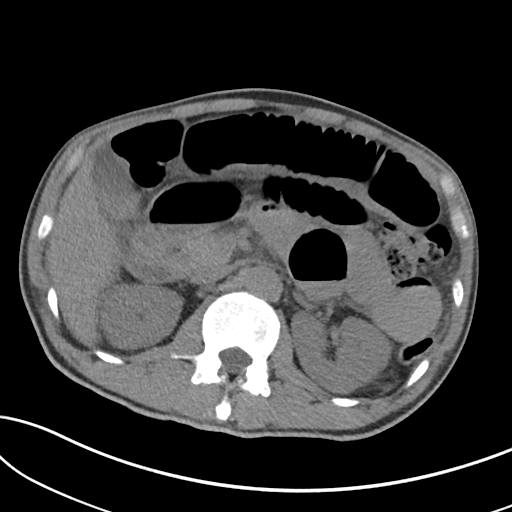
[im 73/102  soft-tissue]
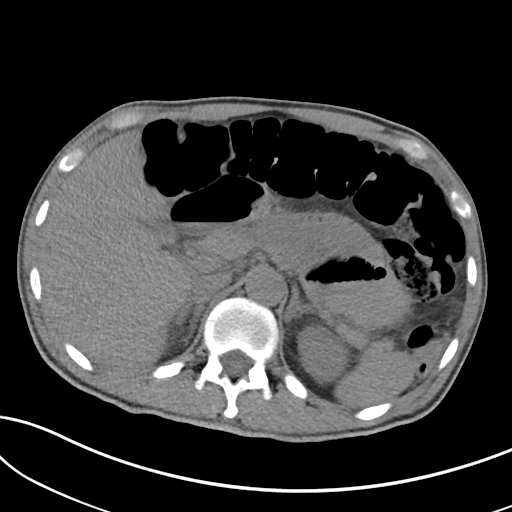
[im 73/102  bone]
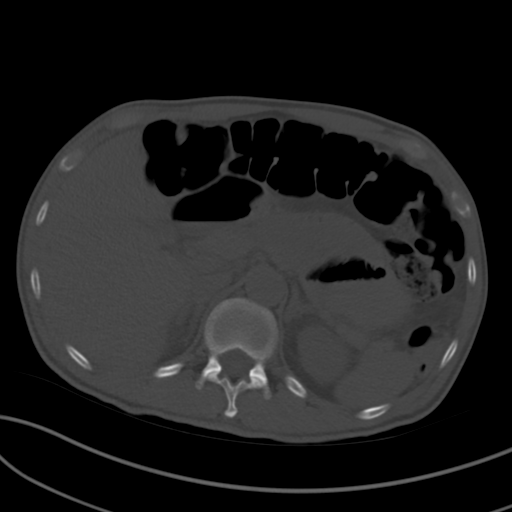
[im 81/102  soft-tissue]
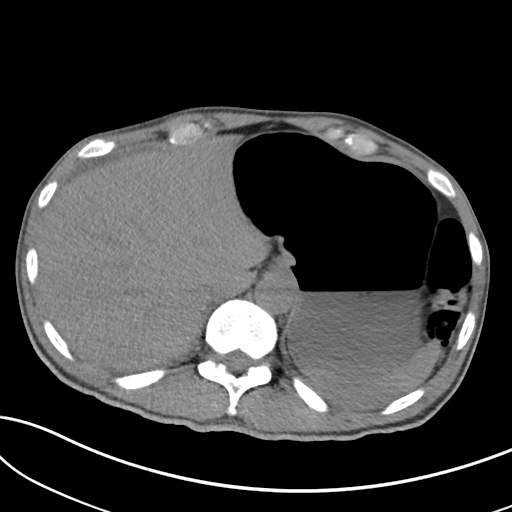
[im 89/102  soft-tissue]
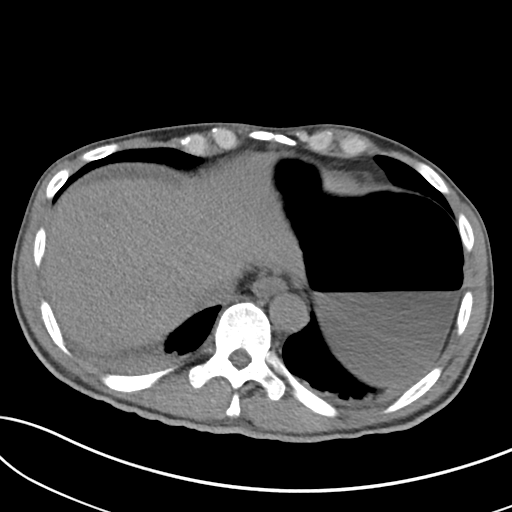
[im 97/102  soft-tissue]
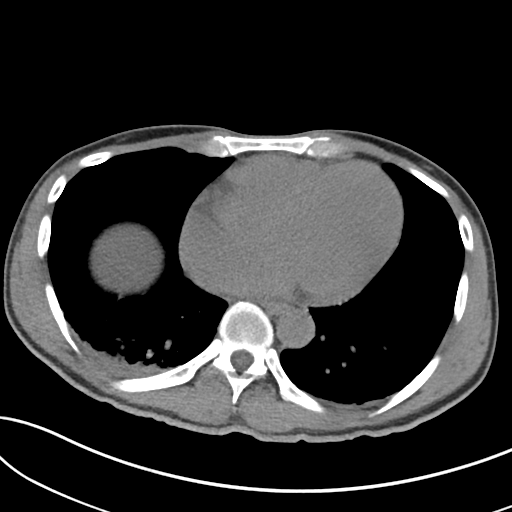

[Series 6: cor st · coronal · 0.67mm/px · 3 of 101 slices shown]
[im 45/101  soft-tissue]
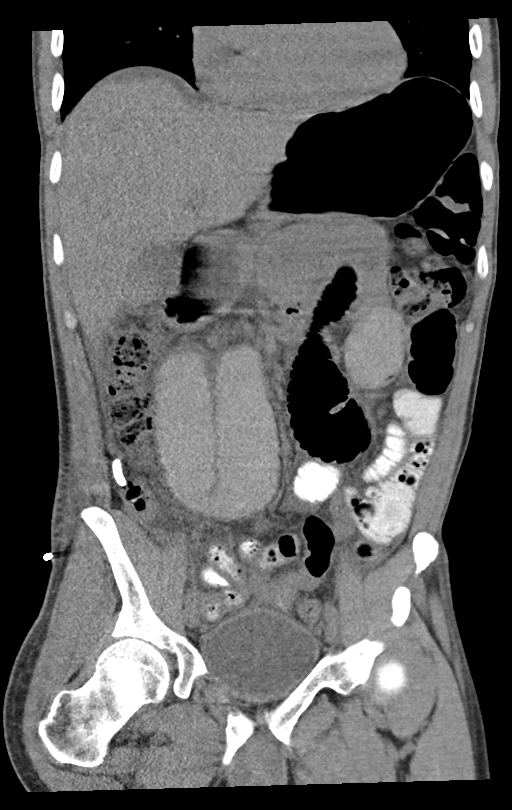
[im 56/101  soft-tissue]
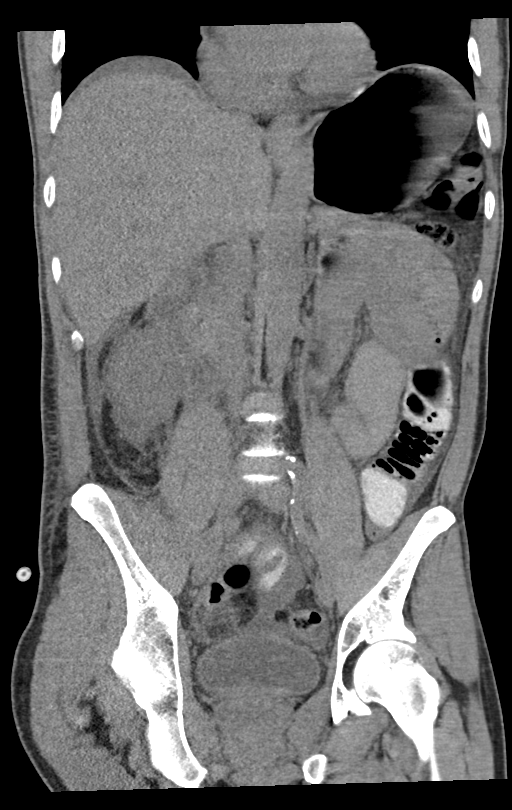
[im 67/101  soft-tissue]
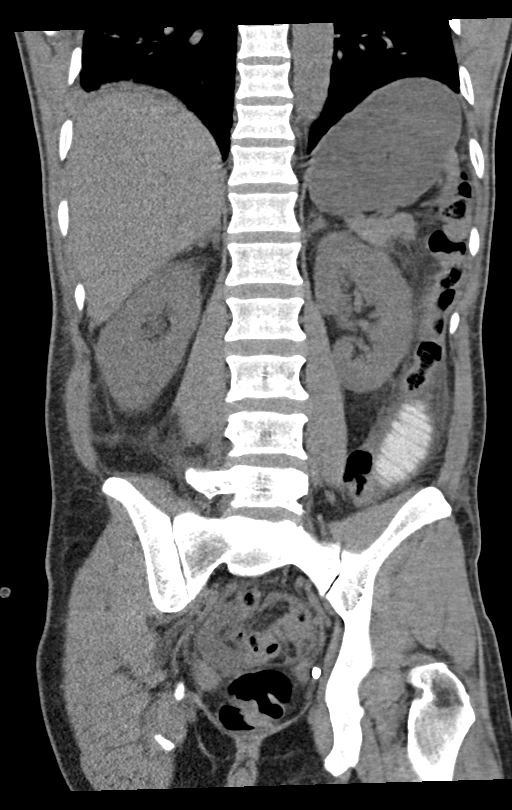

[15 of 46 positions shown; findings below may reference images not displayed]

FINDINGS: Lower chest: Mild bibasilar atelectasis and tiny pleural effusions
greater on RIGHT

Hepatobiliary: Gallbladder and liver normal appearance

Pancreas: Normal appearance

Spleen: Normal appearance

Adrenals/Urinary Tract: Adrenal glands, kidneys, ureters, and
bladder normal appearance

Stomach/Bowel: Again seen enlarged appendix with thickened wall
consistent with acute appendicitis. Near complete resolution of
periappendiceal abscess collection seen on prior exam post
percutaneous drainage, pigtail catheter noted. Gas and stool
throughout colon, minimally prominent size. Multiple dilated small
bowel loops containing fluid and retained contrast consistent with
ileus. Distended stomach containing gas and fluid.

Vascular/Lymphatic: Aorta contains multiple atherosclerotic
calcifications, normal in caliber. No adenopathy.

Reproductive: Unremarkable prostate gland

Other: Small amount of free fluid in abdomen/pelvis and perihepatic.
No free air. No hernia.

Musculoskeletal: Osseous structures unremarkable.
IMPRESSION: Acute appendicitis with significant decrease in size of
periappendiceal abscess collection post percutaneous drainage.

Small amount of free fluid in abdomen/pelvis.

Dilated small bowel loops containing fluid and retained contrast
consistent with ileus.

Bibasilar atelectasis and tiny pleural effusions greater on RIGHT.

No new intra-abdominal or intrapelvic abnormalities.

Aortic Atherosclerosis ([id number]).

## 2021-02-18 IMAGING — DX DG ABD PORTABLE 1V
1 series · 1 of 1 positions shown · non-contrast
Comparison: None

CLINICAL DATA: Check gastric catheter placement

EXAM:
PORTABLE ABDOMEN - 1 VIEW

[abdomen kub]
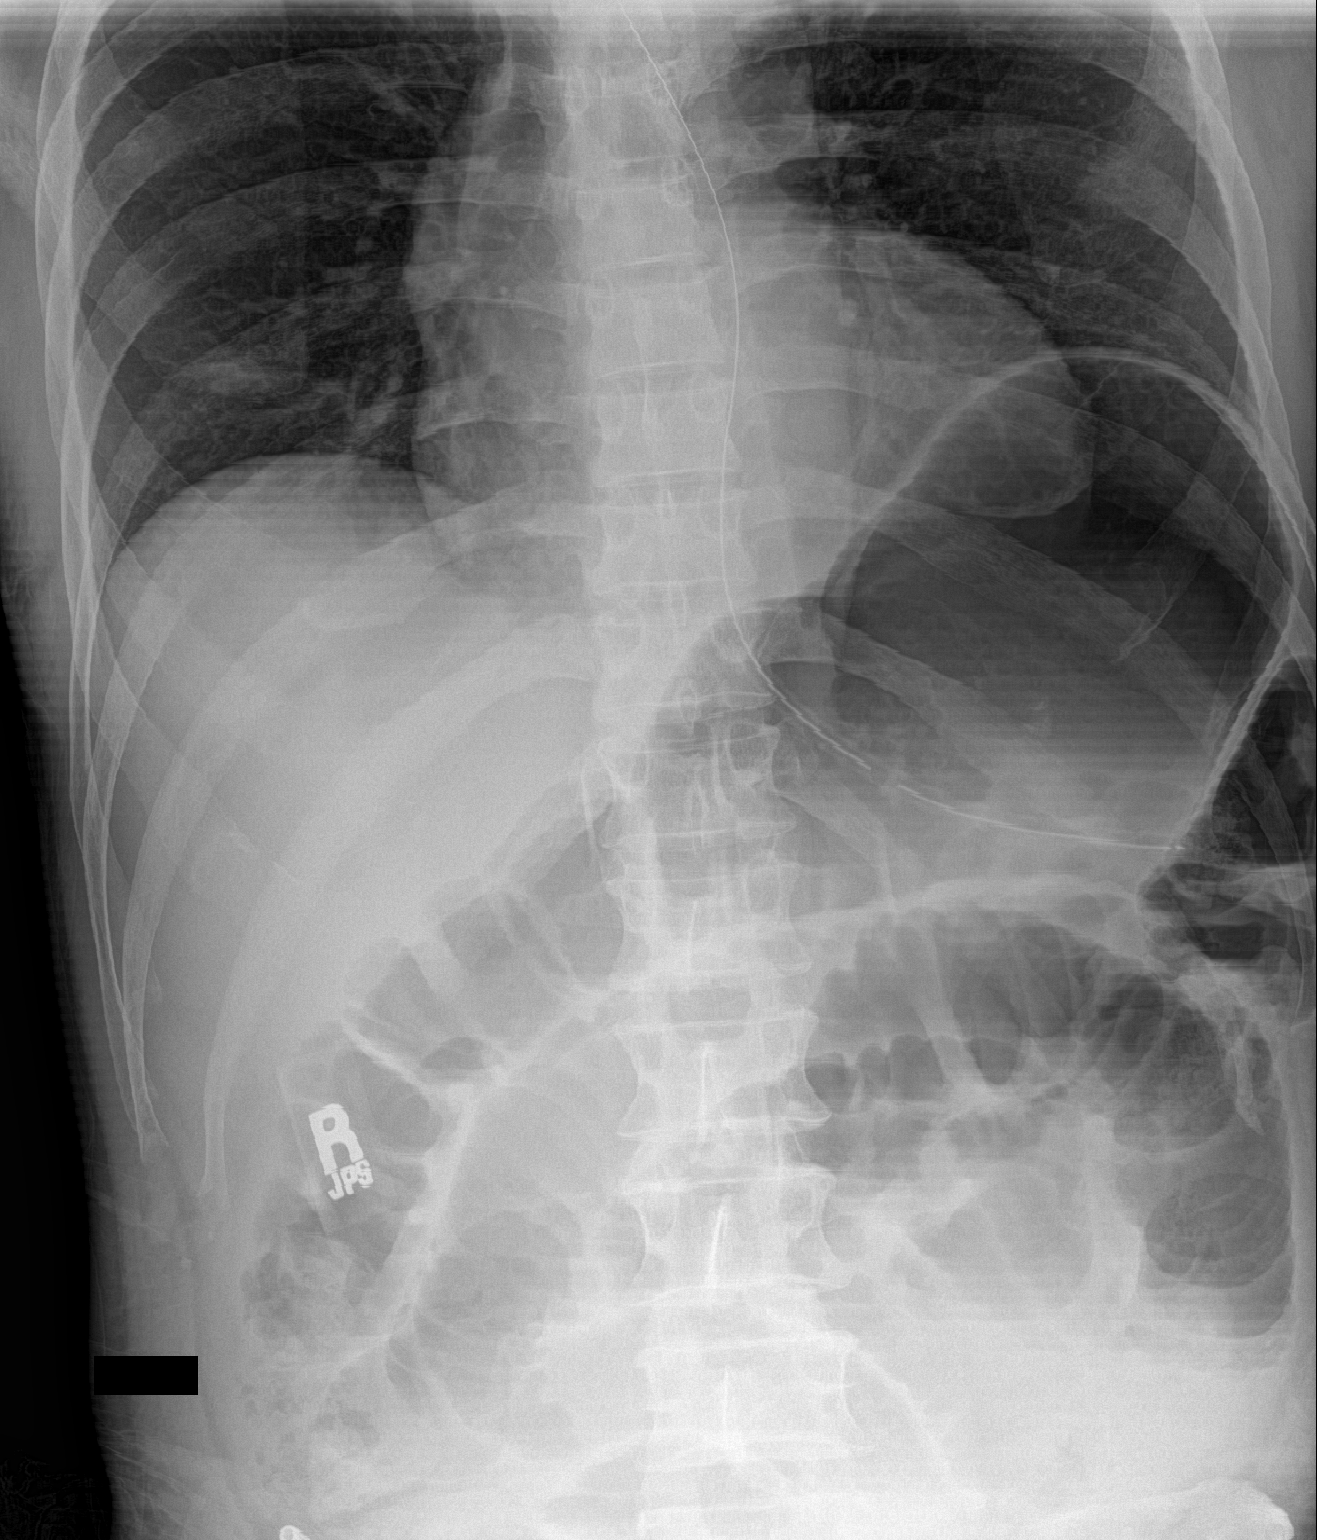

[1 of 1 positions shown; findings below may reference images not displayed]

FINDINGS: Gastric catheter is noted with the tip in the stomach. Proximal side
port is also within the stomach. Scattered small bowel dilatation is
noted. No free air is seen.
IMPRESSION: Gastric catheter within the stomach.

Persistent changes of small-bowel obstruction.

## 2022-12-13 ENCOUNTER — Telehealth: Payer: Self-pay | Admitting: Family Medicine

## 2022-12-13 NOTE — Telephone Encounter (Signed)
Patient has not been seen by current PCP in a year. Reached out to the patient to see if they have a new PCP or if they would like to continue care with the provider.  LVM to schedule
